# Patient Record
Sex: Male | Born: 1960
Health system: Midwestern US, Community
[De-identification: ages and names within clinical notes are randomized; demographics above are authoritative.]

---

## 2012-03-10 NOTE — ED Provider Notes (Signed)
I was personally available for consultation in the emergency department. I have reviewed the chart prior to the patient's discharge and agree with the documentation recorded by the MLP, including the assessment, treatment plan, and disposition.

## 2012-03-10 NOTE — ED Provider Notes (Signed)
HPI Comments: 4:09 PM   51 y.o. male presents to ED C/O left upper gum pain onset last week. The pt has dentures. The pain started last week, went away and returned 2 days ago. Pt felt like he had a fever last night but did not take his temperature. His temperature is 97.17F in the ED. The pt states he is able to swallow but doing so is painful. The pt does not have any of his original teeth, reports he lost his teeth in an accident. He confirms it has been a long time since he has been seen by a dentist. Pt states he works for a [employer], community and she will be back in the office either tomorrow or the next day. Pt denies noticing any pus in his mouth, allergies to any medications and any other sx's or complaints.    Patient is a 51 y.o. male presenting with dental problem. The history is provided by the patient. No language interpreter was used.   Dental Pain   This is a new problem. The current episode started 2 days ago. The problem occurs constantly. The problem has been gradually worsening. The pain is located in the left upper mouth.    Written by Broadus John, ED Scribe, as dictated by Zebedee Iba, PA-C.      History reviewed. No pertinent past medical history.     History reviewed. No pertinent past surgical history.      History reviewed. No pertinent family history.     History     Social History   ??? Marital Status: SINGLE     Spouse Name: N/A     Number of Children: N/A   ??? Years of Education: N/A     Occupational History   ??? Not on file.     Social History Main Topics   ??? Smoking status: Current Every Day Smoker   ??? Smokeless tobacco: Not on file   ??? Alcohol Use: No   ??? Drug Use: No   ??? Sexually Active: Not on file     Other Topics Concern   ??? Not on file     Social History Narrative   ??? No narrative on file            ALLERGIES: Review of patient's allergies indicates no known allergies.      Review of Systems   Constitutional: Negative for activity change, appetite change and unexpected weight change.    HENT: Positive for dental problem. Negative for congestion and sore throat.         (+) left upper gum pain   Respiratory: Negative for shortness of breath.    Cardiovascular: Negative for chest pain.   Gastrointestinal: Negative for nausea, vomiting, abdominal pain and diarrhea.   Genitourinary: Negative for difficulty urinating.   Musculoskeletal: Negative for back pain.   Skin: Negative for rash.   Neurological: Negative for headaches.   All other systems reviewed and are negative.        Filed Vitals:    03/10/12 1552   BP: 129/79   Pulse: 74   Temp: 97.4 ??F (36.3 ??C)   Resp: 18   Height: 5\' 10"  (1.778 m)   Weight: 74.844 kg (165 lb)   SpO2: 98%            Physical Exam   Nursing note and vitals reviewed.  Constitutional: He appears well-developed and well-nourished. No distress.   HENT:   Head: Normocephalic and atraumatic. No trismus in  the jaw.   Mouth/Throat: He has dentures. No oral lesions. Abnormal dentition. No uvula swelling. No oropharyngeal exudate, posterior oropharyngeal edema, posterior oropharyngeal erythema or tonsillar abscesses.   All teeth have been pulled, dentures removed. Upper left superolateral gumline erythematous and slightly inflamed along line of contact with dentures. No obvious abscess or drainage, +TTP. Mild swelling of cheek overlying tender gums, no erythema, warmth or fluctuance.    Neck: Normal range of motion. Neck supple. No tracheal deviation present.   Pulmonary/Chest: No stridor.   Lymphadenopathy:     He has no cervical adenopathy.   Skin: He is not diaphoretic.        MDM     Amount and/or Complexity of Data Reviewed:    Decide to obtain previous medical records or to obtain history from someone other than the patient:  No   Obtain history from someone other than the patient:  No   Review and summarize past medical records:  No   Discuss the patient with another provider:  No   Independant visualization of image, tracing, or specimen:  No      Procedures    Medications  - No data to display     PROGRESS NOTE:   4:16 PM   Answered pt's questions regarding treatment.  Written by Broadus John, ED Scribe, as dictated by Zebedee Iba, PA-C.       RESULTS         Labs Reviewed - No data to display    No results found for this or any previous visit (from the past 12 hour(s)).      Greycen Fluhr's  results have been reviewed with him.  He has been counseled regarding his diagnosis, treatment, and plan.  He verbally conveys understanding and agreement of the signs, symptoms, diagnosis, treatment and prognosis and additionally agrees to follow up as discussed.  He also agrees with the care-plan and conveys that all of his questions have been answered.  I have also provided discharge instructions for him that include: educational information regarding their diagnosis and treatment, and list of reasons why they would want to return to the ED prior to their follow-up appointment, should his condition change.    CLINICAL IMPRESSION    1. Gum inflammation    2. Facial pain        Written by Broadus John, ED Scribe, as dictated by Zebedee Iba, PA-C.

## 2012-03-10 NOTE — ED Notes (Signed)
I have reviewed discharge instructions with the patient.  The patient verbalized understanding.

## 2012-03-10 NOTE — ED Notes (Signed)
Triage note: pt states he has pain to left upper jaw area associated with facial swelling.  Pt has dentures

## 2013-01-04 MED ADMIN — lidocaine (XYLOCAINE) 10 mg/mL (1 %) injection 2 mL: SUBCUTANEOUS | @ 07:00:00 | NDC 00409427601
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
## 2013-01-04 NOTE — ED Notes (Signed)
Patient (s)  given copy of dc instructions and 1 script(s).  Patient (s)  verbalized understanding of instructions and script (s).  Patient given a current medication reconciliation form and verbalized understanding of their medications.   Patient (s) verbalized understanding of the importance of discussing medications with  his or her physician or clinic they will be following up with.  Patient alert and oriented and in no acute distress.  Patient discharged home ambulatory with self.
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
## 2013-01-28 NOTE — Progress Notes (Signed)
Called [facility]enee to send patient for CT of Knee
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
## 2013-01-28 NOTE — ED Notes (Signed)
I have reviewed discharge instructions with the patient.  The patient verbalized understanding. Patient armband removed and shredded

## 2013-01-29 MED ADMIN — cephALEXin (KEFLEX) capsule 500 mg: ORAL | @ 03:00:00 | NDC 68180012101

## 2013-01-29 MED ADMIN — ibuprofen (MOTRIN) tablet 800 mg: ORAL | @ 03:00:00 | NDC 68084065811

## 2013-12-01 NOTE — ED Notes (Signed)
I have reviewed discharge instructions with the patient.  The patient verbalized understanding. Patient armband removed and shredded. Patient unhappy with tylenol/codeine prescription. Patient stated he has taken them before and they don't work and he was going to "buy something from someone else". Nurse noted smell of alcohol on patient's breath. Nurse asked if he would like to ask PA for another medication for pain. Patient declined.

## 2013-12-01 NOTE — ED Provider Notes (Signed)
HPI Comments: 9:16 PM   53 y.o. male presents to ED complaining of swollen, painful abscess to left buttock onset 4 days ago. Pt says his abscess felt like a tick and when he scratched it, "it blew up". Associated symptoms include itching. Pt says he stuck a nail in it and was able to get puss out. Pt works in [employer]. Patient denies hx of abscess, allergies to medications and any other symptoms or complaints.     Patient is a 53 y.o. male presenting with abscess. The history is provided by the patient. No language interpreter was used.   Abscess   This is a new problem. The current episode started more than 2 days ago (4 days). The problem has been gradually worsening. The rash is present on the left buttock. The pain is at a severity of 10/10. Associated symptoms include itching.      Written by Einar Grad, ED Scribe, as dictated by Zebedee Iba, PA-C      History reviewed. No pertinent past medical history.     Past Surgical History   Procedure Laterality Date   ??? Hx hernia repair           History reviewed. No pertinent family history.     History     Social History   ??? Marital Status: SINGLE     Spouse Name: N/A     Number of Children: N/A   ??? Years of Education: N/A     Occupational History   ??? Not on file.     Social History Main Topics   ??? Smoking status: Current Every Day Smoker -- 0.50 packs/day   ??? Smokeless tobacco: Not on file   ??? Alcohol Use: Yes      Comment: occasionally   ??? Drug Use: No   ??? Sexual Activity: Not on file     Other Topics Concern   ??? Not on file     Social History Narrative                  ALLERGIES: Review of patient's allergies indicates no known allergies.      Review of Systems   Constitutional: Negative for fever and fatigue.   HENT: Negative for rhinorrhea and sore throat.    Respiratory: Negative for cough and shortness of breath.    Cardiovascular: Negative for chest pain and palpitations.   Gastrointestinal: Negative for nausea, vomiting, abdominal pain and  diarrhea.   Genitourinary: Negative for dysuria and difficulty urinating.   Musculoskeletal: Negative for myalgias and arthralgias.   Skin: Positive for itching. Negative for color change and rash.        Abscess to left buttock   Neurological: Negative for light-headedness and headaches.   All other systems reviewed and are negative.      Filed Vitals:    12/01/13 2057   BP: 132/78   Pulse: 87   Temp: 97.7 ??F (36.5 ??C)   Resp: 16   Height: 5\' 10"  (1.778 m)   Weight: 77.111 kg (170 lb)   SpO2: 98%            Physical Exam   Nursing note and vitals reviewed.   Vital signs and nursing notes reviewed.    CONSTITUTIONAL: Alert. Well-appearing; well-nourished; in no apparent distress.  EXT: Normal ROM in all four extremities; non-tender to palpation.  SKIN: Normal for age and race; warm; dry; Left medial buttock: 2cm firm, nonfluctuant tender area of indurated skin, +1.5cm  of surrounding nontender erythema, no drainage, vesicles or other rash.   NEURO: A & O x3.   PSYCH:  Mood and affect appropriate.       RESULTS:    No orders to display        Labs Reviewed - No data to display    No results found for this or any previous visit (from the past 12 hour(s)).     MDM     MEDICATIONS GIVEN:  Medications - No data to display     Procedures    PROGRESS NOTE:  9:16 PM  Initial assessment performed. Left buttock abscess, without fluctuance, not ready for I&D, will try warm compresses and abx; if worsens, pt advised to return and I&D may need to be performed in the future.   Written by Einar Grad, ED Scribe, as dictated by Zebedee Iba, PA-C     DISCHARGE NOTE:  9:30 PM     Artist Pais Looper's  results have been reviewed with him.  He has been counseled regarding his diagnosis, treatment, and plan.  He verbally conveys understanding and agreement of the signs, symptoms, diagnosis, treatment and prognosis and additionally agrees to follow up as discussed.   He also agrees with the care-plan and conveys that all of his questions have been answered.  I have also provided discharge instructions for him that include: educational information regarding their diagnosis and treatment, and list of reasons why they would want to return to the ED prior to their follow-up appointment, should his condition change.    CLINICAL IMPRESSION:    1. Abscess of buttock, left        PLAN: DISCHARGE HOME    Follow-up Information    Follow up With Details Comments Contact Info    [facility]   [address] [place] [address]  [place] [address]  [phone]    [facility] EMERGENCY DEPT  If it gets bigger, come back to this facility. [address]  [place] [address]  [phone]          Current Discharge Medication List      START taking these medications    Details   trimethoprim-sulfamethoxazole (BACTRIM DS) 160-800 mg per tablet Take 1 Tab by mouth two (2) times a day for 7 days.  Qty: 14 Tab, Refills: 0      acetaminophen-codeine (TYLENOL-CODEINE #3) 300-30 mg per tablet Take 1 Tab by mouth every six (6) hours as needed for Pain. Max Daily Amount: 4 Tabs.  Qty: 12 Tab, Refills: 0             Written by Einar Grad, ED Scribe, as dictated by Zebedee Iba, PA-C .       I agree with the above documentation as written by the scribe.  Zebedee Iba, PA-C

## 2013-12-01 NOTE — ED Notes (Signed)
Patient stated he has had tetanus shot in the past 10 years and "doesn't need another".

## 2013-12-01 NOTE — ED Notes (Signed)
Abscess to left buttock.

## 2013-12-02 MED ORDER — TRIMETHOPRIM-SULFAMETHOXAZOLE 160 MG-800 MG TAB
160-800 mg | ORAL_TABLET | Freq: Two times a day (BID) | ORAL | Status: AC
Start: 2013-12-02 — End: 2013-12-08

## 2013-12-02 MED ORDER — ACETAMINOPHEN-CODEINE 300 MG-30 MG TAB
300-30 mg | ORAL_TABLET | Freq: Four times a day (QID) | ORAL | Status: DC | PRN
Start: 2013-12-02 — End: 2014-11-17

## 2014-09-18 ENCOUNTER — Emergency Department (HOSPITAL_COMMUNITY): Payer: Self-pay

## 2014-09-18 ENCOUNTER — Encounter (HOSPITAL_COMMUNITY): Payer: Self-pay | Admitting: Emergency Medicine

## 2014-09-18 ENCOUNTER — Emergency Department (HOSPITAL_COMMUNITY)
Admission: EM | Admit: 2014-09-18 | Discharge: 2014-09-18 | Disposition: A | Discharge status: Home / Self Care | Payer: Self-pay | Attending: Emergency Medicine | Admitting: Emergency Medicine

## 2014-09-18 DIAGNOSIS — S93401A Sprain of unspecified ligament of right ankle, initial encounter: Secondary | ICD-10-CM | POA: Insufficient documentation

## 2014-09-18 DIAGNOSIS — Y9389 Activity, other specified: Secondary | ICD-10-CM | POA: Insufficient documentation

## 2014-09-18 DIAGNOSIS — W11XXXA Fall on and from ladder, initial encounter: Secondary | ICD-10-CM | POA: Insufficient documentation

## 2014-09-18 DIAGNOSIS — Z720 Tobacco use: Secondary | ICD-10-CM | POA: Insufficient documentation

## 2014-09-18 DIAGNOSIS — W19XXXA Unspecified fall, initial encounter: Secondary | ICD-10-CM

## 2014-09-18 DIAGNOSIS — Y92008 Other place in unspecified non-institutional (private) residence as the place of occurrence of the external cause: Secondary | ICD-10-CM | POA: Insufficient documentation

## 2014-09-18 DIAGNOSIS — Y998 Other external cause status: Secondary | ICD-10-CM | POA: Insufficient documentation

## 2014-09-18 DIAGNOSIS — T7501XA Shock due to being struck by lightning, initial encounter: Secondary | ICD-10-CM | POA: Insufficient documentation

## 2014-09-18 MED ORDER — IBUPROFEN 800 MG PO TABS: 800.0000 mg | ORAL_TABLET | Freq: Three times a day (TID) | ORAL | Status: AC

## 2014-09-18 MED ORDER — OXYCODONE-ACETAMINOPHEN 5-325 MG PO TABS
2.0000 | ORAL_TABLET | Freq: Once | ORAL | Status: AC
  Administered 2014-09-18: 2 via ORAL
  Filled 2014-09-18: qty 2

## 2014-09-18 NOTE — Discharge Instructions (Signed)
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
## 2014-11-17 ENCOUNTER — Inpatient Hospital Stay
Admit: 2014-11-17 | Discharge: 2014-11-17 | Disposition: A | Discharge status: Home / Self Care | Payer: Self-pay | Attending: Internal Medicine

## 2014-11-17 DIAGNOSIS — L309 Dermatitis, unspecified: Secondary | ICD-10-CM

## 2014-11-17 MED ORDER — HYDROXYZINE 50 MG TAB
50 mg | ORAL_TABLET | Freq: Four times a day (QID) | ORAL | Status: DC | PRN
Start: 2014-11-17 — End: 2014-11-18

## 2014-11-17 MED ORDER — PERMETHRIN 5 % TOPICAL CREAM
5 % | CUTANEOUS | Status: DC
Start: 2014-11-17 — End: 2014-11-18

## 2014-11-17 NOTE — ED Provider Notes (Signed)
HPI Comments: 7:01 PM  Colbert A Trew is a 54 y.o. maSebastian Achele presenting to the ED c/o feeling like there are bugs crawling all over him x a few days. Reports he thinks he has Scabies. States he felt like there was a lesion on his right ear and he picked at it and when it "blew up" he noticed "they were spreading all over my body. Once you piss them off they really get everywhere." Associated sxs include itching. Reports he has not taken anything for his sxs. Admits to picking at his skin resulting in various scabs dispersed across his body and face. Denies any other sxs or complaints.     Patient is a 54 y.o. male presenting with rash. The history is provided by the patient.   Rash   This is a new problem. There has been no fever. The rash is present on the left lower leg, right lower leg, face, head, left arm and right arm. The patient is experiencing no pain. Associated symptoms include blisters, itching and pain.      Written by Georga HackingSapphira Brown, ED Scribe, as dictated by Synthia InnocentBrannon Tavarion Babington, PA-C      Past Medical History:   Diagnosis Date   ??? Scabies        Past Surgical History:   Procedure Laterality Date   ??? Hx hernia repair           History reviewed. No pertinent family history.    History     Social History   ??? Marital Status: SINGLE     Spouse Name: N/A   ??? Number of Children: N/A   ??? Years of Education: N/A     Occupational History   ??? Not on file.     Social History Main Topics   ??? Smoking status: Current Every Day Smoker -- 0.50 packs/day   ??? Smokeless tobacco: Not on file   ??? Alcohol Use: Yes      Comment: occasionally   ??? Drug Use: No   ??? Sexual Activity: Not on file     Other Topics Concern   ??? Not on file     Social History Narrative         ALLERGIES: Review of patient's allergies indicates no known allergies.    Review of Systems   Constitutional: Negative for fever and fatigue.   HENT: Negative for rhinorrhea and sore throat.    Respiratory: Negative for cough and shortness of breath.     Cardiovascular: Negative for chest pain and palpitations.   Gastrointestinal: Negative for nausea, vomiting, abdominal pain and diarrhea.   Genitourinary: Negative for dysuria and difficulty urinating.   Musculoskeletal: Negative for myalgias and arthralgias.   Skin: Positive for itching, rash and wound (lesions all over body from picked scabs). Negative for color change.   Neurological: Negative for light-headedness and headaches.   All other systems reviewed and are negative.      Filed Vitals:    11/17/14 1856   BP: 142/74   Pulse: 105   Temp: 98.2 ??F (36.8 ??C)   Resp: 18   Height: 5\' 10"  (1.778 m)   Weight: 83.915 kg (185 lb)   SpO2: 93%            Physical Exam   Constitutional: He is oriented to person, place, and time. He appears well-developed and well-nourished.   Slightly anxious appearing   HENT:   Head: Normocephalic and atraumatic.   Eyes: Conjunctivae and EOM are normal.  Pupils are equal, round, and reactive to light.   Neck: Normal range of motion. Neck supple.   Musculoskeletal: Normal range of motion.   Neurological: He is alert and oriented to person, place, and time.   Skin: Skin is warm and dry.        +numerous small scabbed lesions noted to BLE's BUE's face & right ear; no web space lesions, no linear distributions of wounds, no exudate ir suprainfection; lesions appear more like picked at insect bites   Psychiatric: He has a normal mood and affect. His behavior is normal.   Nursing note and vitals reviewed.       RESULTS:    No orders to display        Labs Reviewed - No data to display    No results found for this or any previous visit (from the past 12 hour(s)).    MDM    MEDICATIONS GIVEN:  Medications - No data to display    Procedures    PROGRESS NOTE:  7:01 PM  Initial assessment performed.  Written by Georga Hacking, ED Scribe, as dictated by Synthia Innocent, PA-C    DISCHARGE NOTE:  7:09 PM   Artist Pais Golda's  results have been reviewed with him.  He has been  counseled regarding his diagnosis, treatment, and plan.  He verbally conveys understanding and agreement of the signs, symptoms, diagnosis, treatment and prognosis and additionally agrees to follow up as discussed.  He also agrees with the care-plan and conveys that all of his questions have been answered.  I have also provided discharge instructions for him that include: educational information regarding their diagnosis and treatment, and list of reasons why they would want to return to the ED prior to their follow-up appointment, should his condition change. The patient has been provided with education for proper Emergency Department utilization.    CLINICAL IMPRESSION:    1. Dermatitis        PLAN: DISCHARGE HOME    Follow-up Information     Follow up With Details Comments Contact Info    [facility] Schedule an appointment as soon as possible for a visit in 2 days for PCP follow up [address] [place] [address]  [place] [address]  [phone]    [facility] EMERGENCY DEPT Go to As needed, If symptoms worsen [address]  [place] [address]  [phone]          Current Discharge Medication List      START taking these medications    Details   hydrOXYzine (ATARAX) 50 mg tablet Take 1 Tab by mouth every six (6) hours as needed for Itching for up to 10 days.  Qty: 20 Tab, Refills: 0      permethrin (ACTICIN) 5 % topical cream apply sparingly as directed  Qty: 60 g, Refills: 0                 SCRIBE ATTESTATION STATEMENT  Documented [id number] Manson Passey, scribing for and in the presence of Jerriann Schrom, PA-C.     PROVIDER ATTESTATION STATEMENT  I personally performed the services described in the documentation, reviewed the documentation, as recorded by the scribe in my presence, and it accurately and completely records my words and actions.  [employer], PA-C.

## 2014-11-17 NOTE — ED Notes (Signed)
Patient reports scabs and "something alive in my skin. I think I have scabies."

## 2014-11-17 NOTE — ED Notes (Signed)
I have reviewed discharge instructions with the patient.  The patient verbalized understanding.  Patient armband removed and given to patient to take home.  Patient was informed of the privacy risks if armband lost or stolen

## 2014-11-18 ENCOUNTER — Inpatient Hospital Stay
Admit: 2014-11-18 | Discharge: 2014-11-18 | Disposition: A | Discharge status: Home / Self Care | Payer: Self-pay | Attending: Emergency Medicine

## 2014-11-18 DIAGNOSIS — B86 Scabies: Secondary | ICD-10-CM

## 2014-11-18 MED ORDER — PERMETHRIN 5 % TOPICAL CREAM
5 % | CUTANEOUS | Status: AC
Start: 2014-11-18 — End: 2014-11-18
  Administered 2014-11-18: 13:00:00 via TOPICAL

## 2014-11-18 MED ORDER — HYDROXYZINE 50 MG TAB
50 mg | ORAL_TABLET | Freq: Four times a day (QID) | ORAL | Status: AC | PRN
Start: 2014-11-18 — End: 2014-11-28

## 2014-11-18 MED ORDER — PERMETHRIN 5 % TOPICAL CREAM
5 % | CUTANEOUS | Status: DC
Start: 2014-11-18 — End: 2016-03-14

## 2014-11-18 MED FILL — PERMETHRIN 5 % TOPICAL CREAM: 5 % | CUTANEOUS | Qty: 60

## 2014-11-18 NOTE — ED Notes (Signed)
Discharge instructions given to pt.  All questions answered and pt verbalized understanding.   V/S stable @ time of discharge.  Pt ambulatory out of unit.

## 2014-11-18 NOTE — ED Provider Notes (Signed)
HPI Comments: 7:47 AM   Jacob Turner is a 54 y.o. male with hx of Scabies presenting to the ED C/O "parasites" over his body. The pt was seen here in the ED yesterday for similar sx's. He reports there is something moving in his scab to the tip of his LT index finger. Pt states he took 5 "hot baths" yesterday and notes "everything is popping out." He denies using the cream he was prescribed due to him not being able to get it. Pt denies any other Sx or complaints.     Written by Doran Clay, ED Scribe, as dictated by Romero Liner, PA-C    Patient is a 54 y.o. male presenting with rash. The history is provided by the patient. No language interpreter was used.   Rash   This is a new problem. The problem has not changed since onset.Affected Location: Generalized body. Treatments tried: "5 hot baths" The treatment provided no relief.        Past Medical History:   Diagnosis Date   ??? Scabies        Past Surgical History:   Procedure Laterality Date   ??? Hx hernia repair           History reviewed. No pertinent family history.    History     Social History   ??? Marital Status: SINGLE     Spouse Name: N/A   ??? Number of Children: N/A   ??? Years of Education: N/A     Occupational History   ??? Not on file.     Social History Main Topics   ??? Smoking status: Current Every Day Smoker -- 0.50 packs/day   ??? Smokeless tobacco: Not on file   ??? Alcohol Use: Yes      Comment: occasionally   ??? Drug Use: No   ??? Sexual Activity: Not on file     Other Topics Concern   ??? Not on file     Social History Narrative         ALLERGIES: Review of patient's allergies indicates no known allergies.    Review of Systems   Skin: Positive for rash (Scabs over generalized body).   All other systems reviewed and are negative.      Filed Vitals:    11/18/14 0740   BP: 166/88   Pulse: 86   Temp: 97.9 ??F (36.6 ??C)   Resp: 20   Height:  (1.778 m)   Weight: 83.915 kg (185 lb)   SpO2: 100%            Physical Exam    Constitutional: He appears well-developed and well-nourished. No distress.   HENT:   Head: Normocephalic and atraumatic.   Right Ear: Tympanic membrane and ear canal normal.   Left Ear: Tympanic membrane and ear canal normal.   Mouth/Throat: Uvula is midline and mucous membranes are normal.   Neck: Trachea normal, normal range of motion and full passive range of motion without pain. Neck supple.   Cardiovascular: Normal rate, regular rhythm, normal heart sounds and normal pulses.    Pulmonary/Chest: Effort normal and breath sounds normal.   Abdominal: Normal appearance.   Musculoskeletal: Normal range of motion.   Neurological: He is alert. He has normal strength and normal reflexes.   Skin: Skin is warm and dry. Lesion noted. He is not diaphoretic.   Multiple scattered lesions with excoriations where patient has scabs and has picked at skin.  No secondary infection noted.  No web space involvement and no linear presentation or evidence of tunneling.   Psychiatric: His speech is normal and behavior is normal. Judgment normal. His mood appears anxious.   Nursing note and vitals reviewed.       RESULTS:    No orders to display        Labs Reviewed - No data to display    No results found for this or any previous visit (from the past 12 hour(s)).     MDM  Number of Diagnoses or Management Options  Scabies:   Diagnosis management comments: Could be scabies based on Hx.  Patient was Rx cream for scabies yesterday but did not fell it due to cost.  Will get life coach involved to help with Rx.       Amount and/or Complexity of Data Reviewed  Review and summarize past medical records: yes         MEDICATIONS GIVEN:  Medications - No data to display     Procedures     PROGRESS NOTE:  7:47 PM  Initial assessment performed.  Written by Doran ClayLasha' Tipton, ED Scribe, as dictated by Romero Linerobert Ladaisha Portillo, PA-C    DISCHARGE NOTE:  8:03 AM    Jacob Turner's  results have been reviewed with him.  He has been  counseled regarding his diagnosis, treatment, and plan.  He verbally conveys understanding and agreement of the signs, symptoms, diagnosis, treatment and prognosis and additionally agrees to follow up as discussed.  He also agrees with the care-plan and conveys that all of his questions have been answered.  I have also provided discharge instructions for him that include: educational information regarding their diagnosis and treatment, and list of reasons why they would want to return to the ED prior to their follow-up appointment, should his condition change. The patient and/or family has been provided with education for proper Emergency Department utilization.    CLINICAL IMPRESSION:    1. Scabies        PLAN: DISCHARGE HOME    Follow-up Information     Follow up With Details Comments Contact Info    [facility] Schedule an appointment as soon as possible for a visit  [address]  [place] [id number]  [place] [id number]  [phone]    [facility]  As needed, If symptoms worsen [address]  [place] [id number]  [phone]          Current Discharge Medication List      CONTINUE these medications which have CHANGED    Details   permethrin (ACTICIN) 5 % topical cream apply sparingly as directed  Qty: 60 g, Refills: 0      hydrOXYzine (ATARAX) 50 mg tablet Take 1 Tab by mouth every six (6) hours as needed for Itching for up to 10 days.  Qty: 20 Tab, Refills: 0             ATTESTATIONS:  This note is prepared by Doran ClayLasha' Tipton, acting as Scribe for [employer] Sidni Fusco, PA-C.    Romero Linerobert Jameek Bruntz, PA-C: The scribe's documentation has been prepared under my direction and personally reviewed by me in its entirety. I confirm that the note above accurately reflects all work, treatment, procedures, and medical decision making performed by me.

## 2014-11-18 NOTE — ED Notes (Signed)
States he was not signing any papers. Patient very impulsive.

## 2014-11-18 NOTE — ED Notes (Signed)
Patient was seen here last night and states that he has parasites crawling inside of him. Patient extremely agitated in triage.  Patient insistent that he was oozing white stuff.  Patient with flight of ideas in triage. Sepsis Screening completed    (  )Patient meets SIRS criteria.  ( x)Patient does not meet SIRS criteria.      SIRS Criteria is achieved when two or more of the following are present  ? Temperature < 96.8??F (36??C) or > 100.9??F (38.3??C)  ? Heart Rate > 90 beats per minute  ? Respiratory Rate > 20 beats per minute  ? WBC count > 12,000 or <4,000 or > 10% bands

## 2014-11-21 MED ORDER — PERMETHRIN 5 % TOPICAL CREAM
5 % | CUTANEOUS | Status: AC
Start: 2014-11-21 — End: 2014-11-21

## 2014-11-21 NOTE — Other (Signed)
Pt called ED stating he didn't have enough cream, B Cater PA made aware and will write another script for permethrin, pt informed to pick up script at ED registration. Per B. Cater PA noted on pt's chart that he had rec'd a script for permethrin on 7/12 and 7/13.

## 2015-12-31 IMAGING — CR DG ANKLE COMPLETE 3+V*R*
3 series · 3 of 3 positions shown · non-contrast
Comparison: None.

CLINICAL DATA: Medial ankle pain.  Fall from ladder.

EXAM:
RIGHT ANKLE - COMPLETE 3+ VIEW

[x ankle ap right]
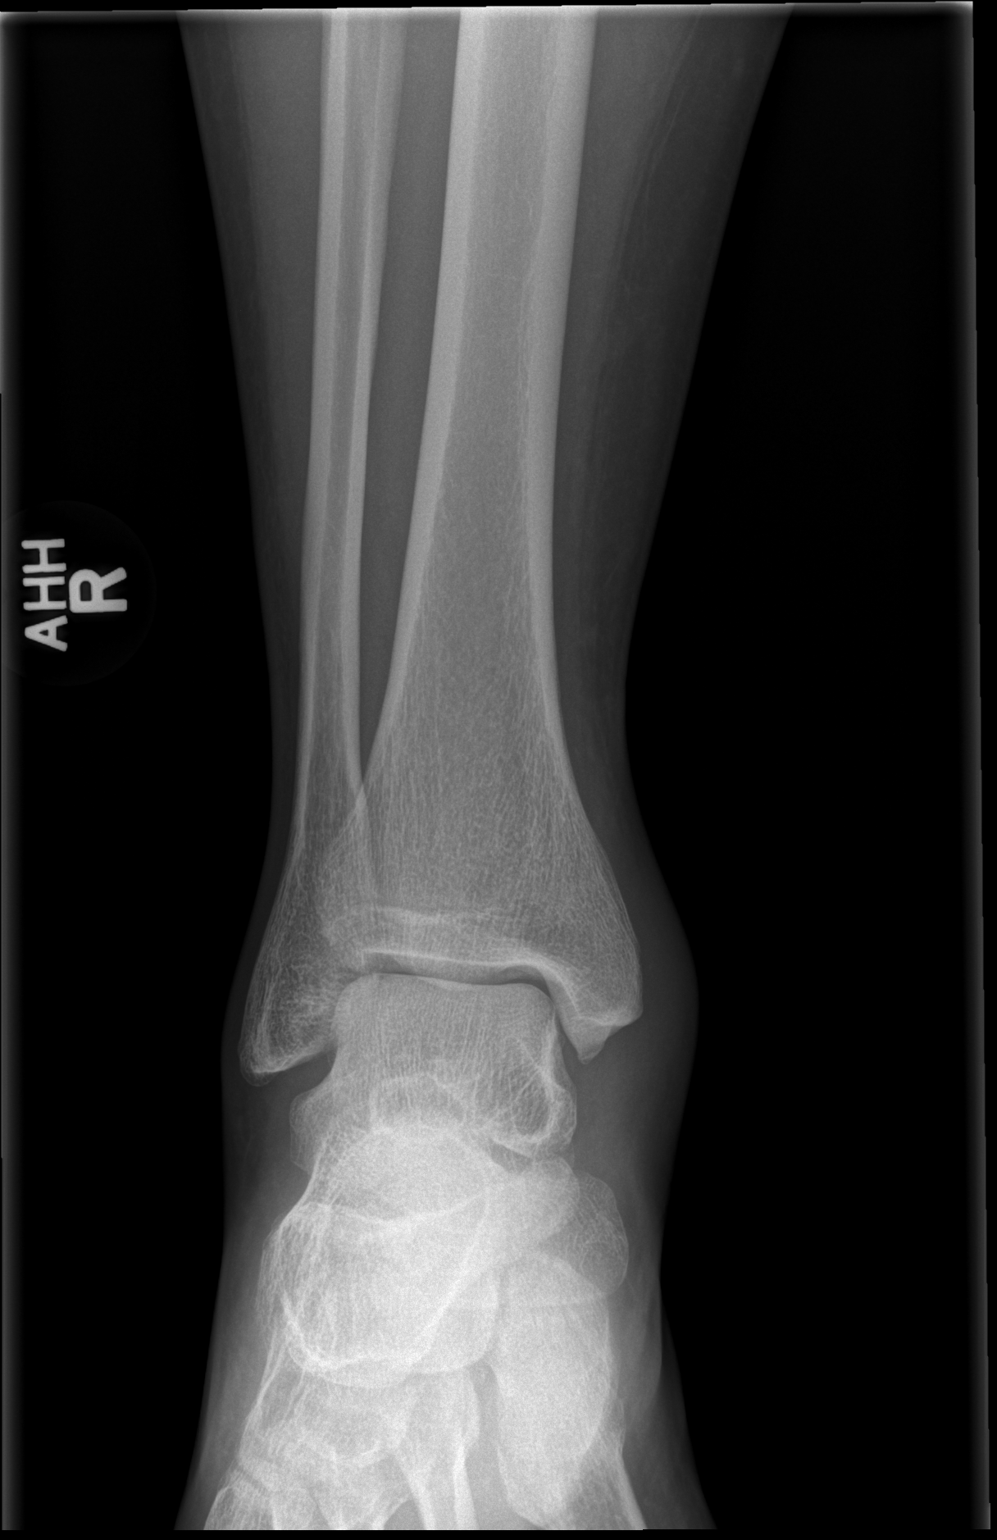

[x ankle obl right]
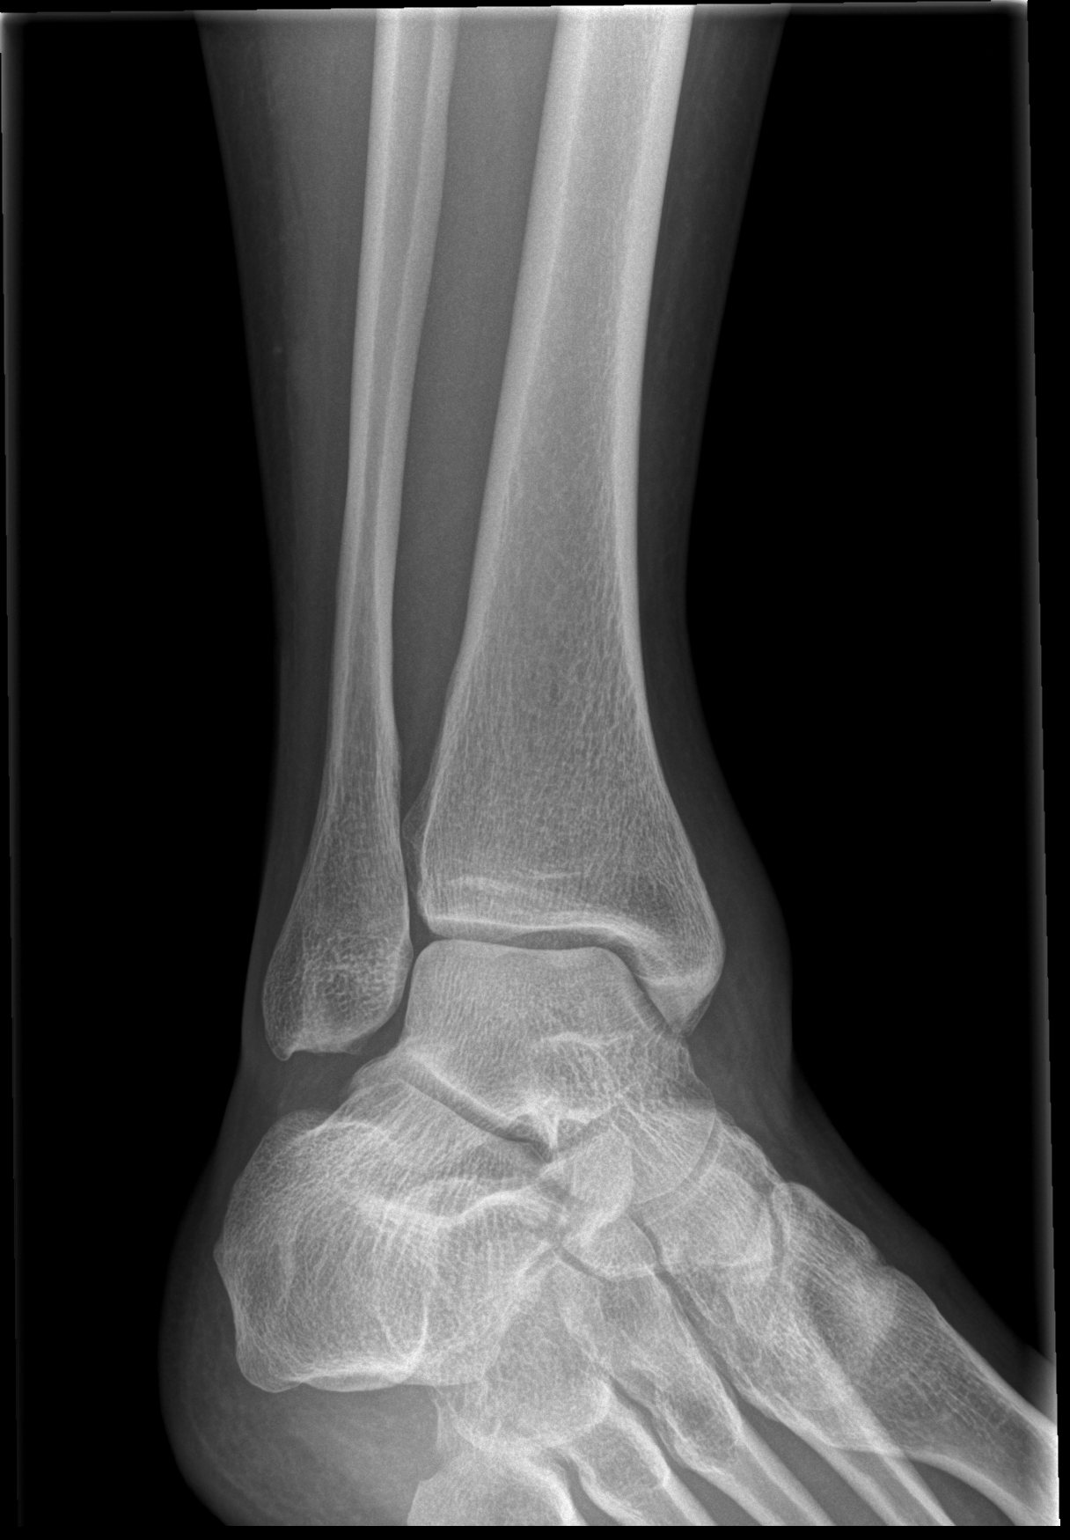

[x ankle lat right]
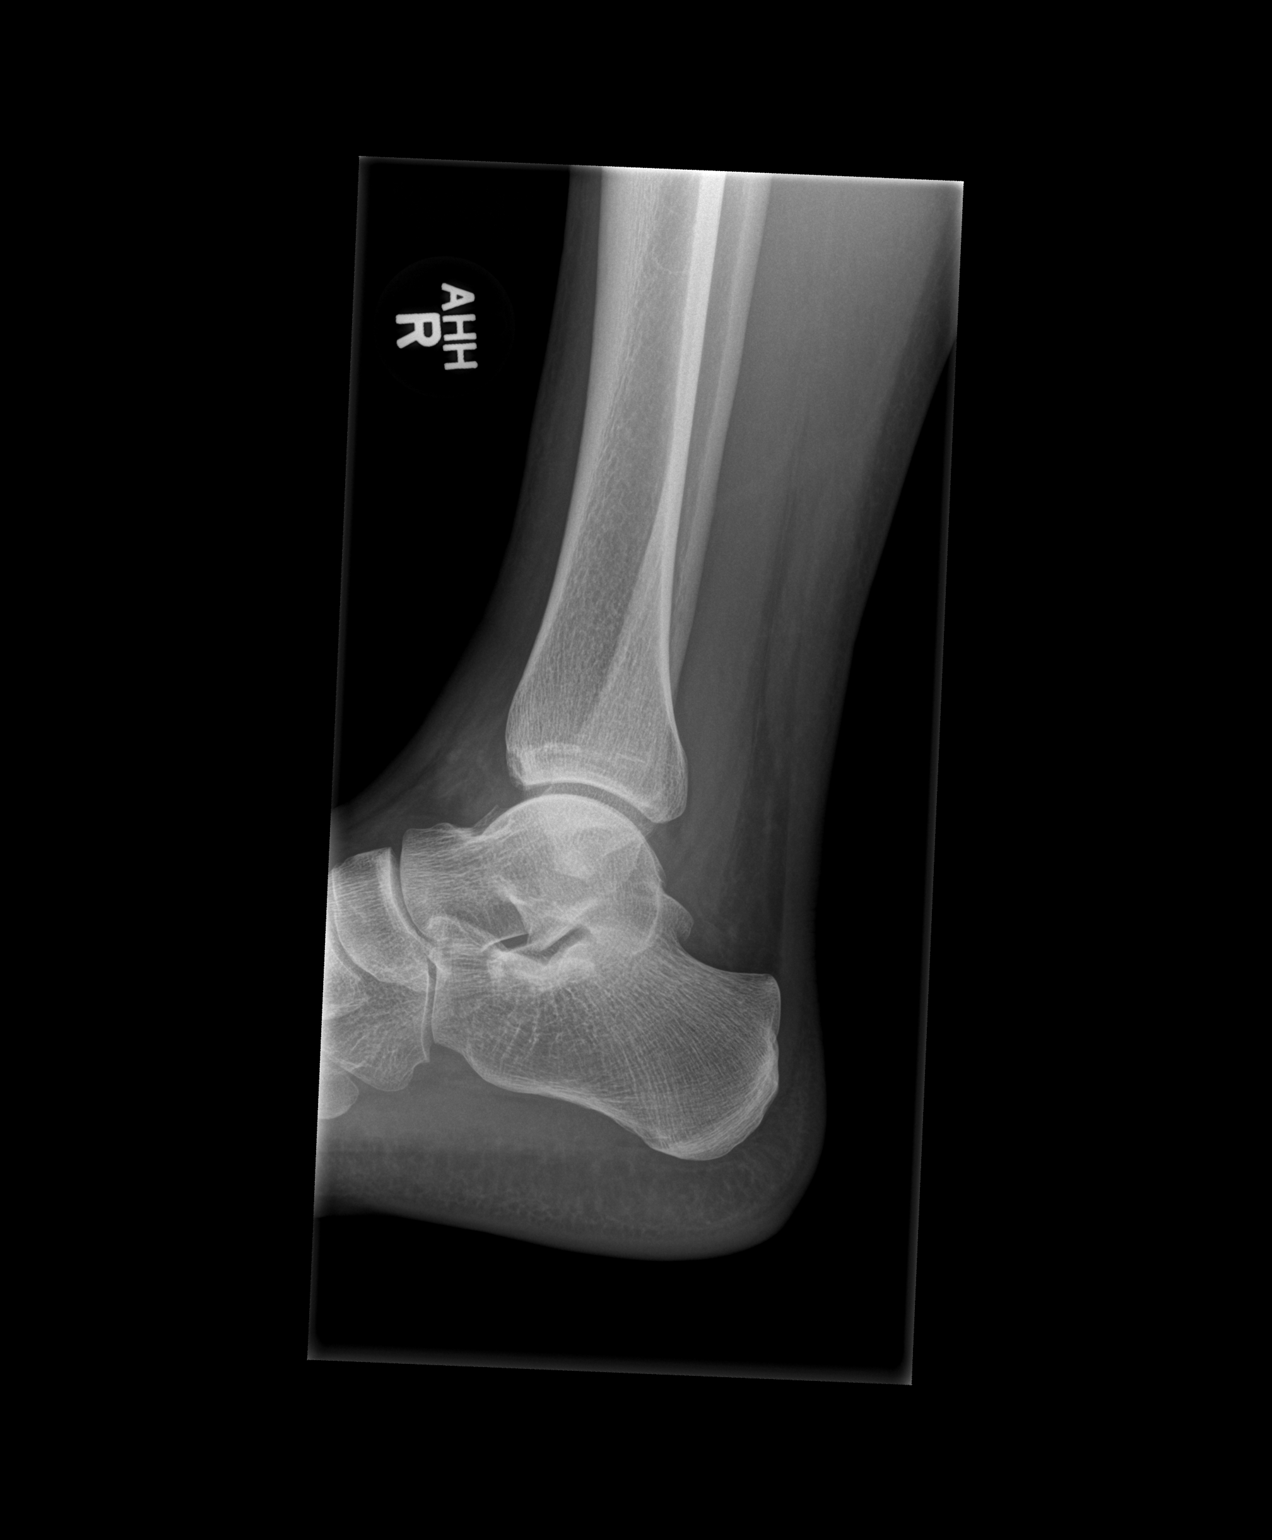

[3 of 3 positions shown; findings below may reference images not displayed]

FINDINGS: Soft tissue swelling is present over the media malleable is. There
is no underlying fracture. The ankle joint is located. A joint
effusion is noted.
IMPRESSION: 1. Soft tissue swelling along the medial malleolus and associated
joint effusion.
2. No acute fracture or dislocation.

## 2016-03-14 ENCOUNTER — Emergency Department: Admit: 2016-03-14

## 2016-03-14 ENCOUNTER — Inpatient Hospital Stay
Admit: 2016-03-14 | Discharge: 2016-03-14 | Disposition: A | Discharge status: Home / Self Care | Attending: Emergency Medicine

## 2016-03-14 DIAGNOSIS — R112 Nausea with vomiting, unspecified: Secondary | ICD-10-CM

## 2016-03-14 LAB — URINALYSIS W/ RFLX MICROSCOPIC
Bilirubin: NEGATIVE
Blood: NEGATIVE
Glucose: NEGATIVE mg/dL
Ketone: NEGATIVE mg/dL
Nitrites: NEGATIVE
Protein: NEGATIVE mg/dL
Specific gravity: 1.026 (ref 1.005–1.030)
Urobilinogen: 1 EU/dL (ref 0.2–1.0)
pH (UA): 5 (ref 5.0–8.0)

## 2016-03-14 LAB — METABOLIC PANEL, COMPREHENSIVE
A-G Ratio: 0.8 (ref 0.8–1.7)
ALT (SGPT): 47 U/L (ref 16–61)
AST (SGOT): 29 U/L (ref 15–37)
Albumin: 3.6 g/dL (ref 3.4–5.0)
Alk. phosphatase: 120 U/L — ABNORMAL HIGH (ref 45–117)
Anion gap: 13 mmol/L (ref 3.0–18)
BUN/Creatinine ratio: 19 (ref 12–20)
BUN: 18 MG/DL (ref 7.0–18)
Bilirubin, total: 0.9 MG/DL (ref 0.2–1.0)
CO2: 24 mmol/L (ref 21–32)
Calcium: 9.2 MG/DL (ref 8.5–10.1)
Chloride: 101 mmol/L (ref 100–108)
Creatinine: 0.95 MG/DL (ref 0.6–1.3)
GFR est AA: >60 mL/min/{1.73_m2} (ref >60)
GFR est non-AA: >60 mL/min/{1.73_m2} (ref >60)
Globulin: 4.4 g/dL — ABNORMAL HIGH (ref 2.0–4.0)
Glucose: 109 mg/dL — ABNORMAL HIGH (ref 74–99)
Potassium: 4 mmol/L (ref 3.5–5.5)
Protein, total: 8 g/dL (ref 6.4–8.2)
Sodium: 138 mmol/L (ref 136–145)

## 2016-03-14 LAB — CARDIAC PANEL,(CK, CKMB & TROPONIN)
CK - MB: <1 ng/ml (ref <3.6)
CK: 160 U/L (ref 39–308)
Troponin-I, QT: <0.02 NG/ML (ref 0.00–0.06)

## 2016-03-14 LAB — CBC WITH AUTOMATED DIFF
ABS. BASOPHILS: 0 10*3/uL (ref 0.0–0.06)
ABS. EOSINOPHILS: 0.3 10*3/uL (ref 0.0–0.4)
ABS. LYMPHOCYTES: 3.1 10*3/uL (ref 0.9–3.6)
ABS. MONOCYTES: 1 10*3/uL (ref 0.05–1.2)
ABS. NEUTROPHILS: 5.6 10*3/uL (ref 1.8–8.0)
BASOPHILS: 0 % (ref 0–2)
EOSINOPHILS: 3 % (ref 0–5)
HCT: 44.2 % (ref 36.0–48.0)
HGB: 15.6 g/dL (ref 13.0–16.0)
LYMPHOCYTES: 31 % (ref 21–52)
MCH: 30.9 PG (ref 24.0–34.0)
MCHC: 35.3 g/dL (ref 31.0–37.0)
MCV: 87.5 FL (ref 74.0–97.0)
MONOCYTES: 10 % (ref 3–10)
MPV: 10 FL (ref 9.2–11.8)
NEUTROPHILS: 56 % (ref 40–73)
PLATELET: 259 10*3/uL (ref 135–420)
RBC: 5.05 M/uL (ref 4.70–5.50)
RDW: 12.6 % (ref 11.6–14.5)
WBC: 10 10*3/uL (ref 4.6–13.2)

## 2016-03-14 LAB — URINE MICROSCOPIC ONLY
RBC: NEGATIVE /hpf (ref 0–5)
WBC: 4 /hpf (ref 0–5)

## 2016-03-14 LAB — LIPASE: Lipase: 111 U/L (ref 73–393)

## 2016-03-14 MED ORDER — ONDANSETRON (PF) 4 MG/2 ML INJECTION
4 mg/2 mL | INTRAMUSCULAR | Status: AC
Start: 2016-03-14 — End: 2016-03-14
  Administered 2016-03-14: 12:00:00 via INTRAVENOUS

## 2016-03-14 MED ORDER — IOPAMIDOL 61 % IV SOLN
300 mg iodine /mL (61 %) | Freq: Once | INTRAVENOUS | Status: AC
Start: 2016-03-14 — End: 2016-03-14
  Administered 2016-03-14: 12:00:00 via INTRAVENOUS

## 2016-03-14 MED ORDER — ONDANSETRON (PF) 4 MG/2 ML INJECTION
4 mg/2 mL | INTRAMUSCULAR | Status: AC
Start: 2016-03-14 — End: 2016-03-14

## 2016-03-14 MED ORDER — GI COCKTAIL (MIH CMPD)
Freq: Four times a day (QID) | ORAL | Status: DC
Start: 2016-03-14 — End: 2016-03-14
  Administered 2016-03-14: 12:00:00 via ORAL

## 2016-03-14 MED ORDER — PANTOPRAZOLE 40 MG TAB, DELAYED RELEASE
40 mg | ORAL_TABLET | Freq: Every day | ORAL | 0 refills | Status: AC
Start: 2016-03-14 — End: 2016-04-03

## 2016-03-14 MED ORDER — FAMOTIDINE (PF) 20 MG/2 ML IV
20 mg/2 mL | INTRAVENOUS | Status: DC
Start: 2016-03-14 — End: 2016-03-14

## 2016-03-14 MED ORDER — FAMOTIDINE (PF) 20 MG/2 ML IV
20 mg/2 mL | INTRAVENOUS | Status: AC
Start: 2016-03-14 — End: 2016-03-14
  Administered 2016-03-14: 12:00:00 via INTRAVENOUS

## 2016-03-14 MED ORDER — SODIUM CHLORIDE 0.9% BOLUS IV
0.9 % | Freq: Once | INTRAVENOUS | Status: AC
Start: 2016-03-14 — End: 2016-03-14
  Administered 2016-03-14: 12:00:00 via INTRAVENOUS

## 2016-03-14 MED FILL — ISOVUE-300  61 % INTRAVENOUS SOLUTION: 300 mg iodine /mL (61 %) | INTRAVENOUS | Qty: 100

## 2016-03-14 MED FILL — SODIUM CHLORIDE 0.9 % IV: INTRAVENOUS | Qty: 1000

## 2016-03-14 MED FILL — FAMOTIDINE (PF) 20 MG/2 ML IV: 20 mg/2 mL | INTRAVENOUS | Qty: 2

## 2016-03-14 MED FILL — GI COCKTAIL (MIH CMPD): ORAL | Qty: 30

## 2016-03-14 MED FILL — ONDANSETRON (PF) 4 MG/2 ML INJECTION: 4 mg/2 mL | INTRAMUSCULAR | Qty: 2

## 2016-03-14 NOTE — ED Notes (Signed)
Report received - assumed care of pt at this time - Pt in CT, will reassess upon return to room.

## 2016-03-14 NOTE — ED Triage Notes (Signed)
Episode of vomiting X 3 hours on Saturday night, states he has been having pain in the epigastric area so bad that he could not come to ED until today

## 2016-03-14 NOTE — ED Provider Notes (Signed)
[facility]  EMERGENCY DEPARTMENT HISTORY AND PHYSICAL EXAM       Date: 03/14/2016   Patient Name: Jacob Turner   Date of Birth: 02-22-1961  Medical Record Number: [id number]    History of Presenting Illness     Chief Complaint   Patient presents with   ??? Abdominal Pain        History Provided By:  patient    Additional History: 6:06 AM   NAWAF STRANGE is a 55 y.o. male who presents to the emergency department C/O 5/10 abd pain onset the past few days. Associated sxs include black/red/brown/greasy vomiting onset a few days ago and intermittent rectal bleeding onset a few months ago. Pt has taken Tums with no relief. Pt is unsure if he has a hemorrhoid but has no hx. Last BM was 24 hours ago. NKDA. PSHx of hernia repair. SHx includes tobacco use (.5 ppd) and occasional EtOH use. Pt denies fever, PMHx of CVA, MI, PSHx of endoscopy and any other symptoms or complaints.    Written by Phillis Knack, ED Scribe, as dictated by Smitty Knudsen, MD     Primary Care Provider: None   Specialist:    Past History     Past Medical History:   Past Medical History:   Diagnosis Date   ??? Scabies         Past Surgical History:   Past Surgical History:   Procedure Laterality Date   ??? HX HERNIA REPAIR          Family History:   History reviewed. No pertinent family history.     Social History:   Social History   Substance Use Topics   ??? Smoking status: Current Every Day Smoker     Packs/day: 0.50     Years: 35.00   ??? Smokeless tobacco: Never Used   ??? Alcohol use Yes      Comment: occasionally        Allergies:   No Known Allergies     Review of Systems   Review of Systems   Constitutional: Negative for fever.   Gastrointestinal: Positive for abdominal pain (5/10), rectal pain and vomiting.   All other systems reviewed and are negative.      Physical Exam  Vitals:    03/14/16 0555 03/14/16 0624 03/14/16 0725 03/14/16 0728   BP: 146/84 140/75 133/78    Pulse: 81  60 60   Resp: 18   18    Temp: 98.4 ??F (36.9 ??C)      SpO2: 99%  100%    Weight: 90.7 kg (200 lb)      Height: 5' 10"  (1.778 m)          Physical Exam   Constitutional: He is oriented to person, place, and time. He appears well-developed and well-nourished.   HENT:   Head: Normocephalic and atraumatic.   Eyes: Pupils are equal, round, and reactive to light.   Neck: Neck supple.   Cardiovascular: Regular rhythm, S1 normal, S2 normal and normal heart sounds.  Tachycardia present.    Pulmonary/Chest: Breath sounds normal. No respiratory distress. He has no wheezes. He has no rales. He exhibits no tenderness.   Abdominal: Soft. He exhibits no distension and no mass. There is tenderness (mild) in the epigastric area. There is no guarding.   Musculoskeletal: Normal range of motion. He exhibits no edema or tenderness.   Neurological: He is alert and oriented to person, place, and time.  No cranial nerve deficit.   Skin: No rash noted.   Psychiatric: He has a normal mood and affect. His behavior is normal. Thought content normal.   Nursing note and vitals reviewed.    Diagnostic Study Results     Labs -      Recent Results (from the past 12 hour(s))   URINALYSIS W/ RFLX MICROSCOPIC    Collection Time: 03/14/16  6:05 AM   Result Value Ref Range    Color YELLOW      Appearance CLEAR      Specific gravity 1.026 1.005 - 1.030      pH (UA) 5.0 5.0 - 8.0      Protein NEGATIVE  NEG mg/dL    Glucose NEGATIVE  NEG mg/dL    Ketone NEGATIVE  NEG mg/dL    Bilirubin NEGATIVE  NEG      Blood NEGATIVE  NEG      Urobilinogen 1.0 0.2 - 1.0 EU/dL    Nitrites NEGATIVE  NEG      Leukocyte Esterase SMALL (A) NEG     URINE MICROSCOPIC ONLY    Collection Time: 03/14/16  6:05 AM   Result Value Ref Range    WBC 4 to 10 0 - 5 /hpf    RBC NEGATIVE  0 - 5 /hpf    Epithelial cells FEW 0 - 5 /lpf    Bacteria RARE NEG /hpf    Mucus 3+ (A) NEG /lpf   CBC WITH AUTOMATED DIFF    Collection Time: 03/14/16  6:20 AM   Result Value Ref Range    WBC 10.0 4.6 - 13.2 K/uL     RBC 5.05 4.70 - 5.50 M/uL    HGB 15.6 13.0 - 16.0 g/dL    HCT 44.2 36.0 - 48.0 %    MCV 87.5 74.0 - 97.0 FL    MCH 30.9 24.0 - 34.0 PG    MCHC 35.3 31.0 - 37.0 g/dL    RDW 12.6 11.6 - 14.5 %    PLATELET 259 135 - 420 K/uL    MPV 10.0 9.2 - 11.8 FL    NEUTROPHILS 56 40 - 73 %    LYMPHOCYTES 31 21 - 52 %    MONOCYTES 10 3 - 10 %    EOSINOPHILS 3 0 - 5 %    BASOPHILS 0 0 - 2 %    ABS. NEUTROPHILS 5.6 1.8 - 8.0 K/UL    ABS. LYMPHOCYTES 3.1 0.9 - 3.6 K/UL    ABS. MONOCYTES 1.0 0.05 - 1.2 K/UL    ABS. EOSINOPHILS 0.3 0.0 - 0.4 K/UL    ABS. BASOPHILS 0.0 0.0 - 0.06 K/UL    DF AUTOMATED     METABOLIC PANEL, COMPREHENSIVE    Collection Time: 03/14/16  6:20 AM   Result Value Ref Range    Sodium 138 136 - 145 mmol/L    Potassium 4.0 3.5 - 5.5 mmol/L    Chloride 101 100 - 108 mmol/L    CO2 24 21 - 32 mmol/L    Anion gap 13 3.0 - 18 mmol/L    Glucose 109 (H) 74 - 99 mg/dL    BUN 18 7.0 - 18 MG/DL    Creatinine 0.95 0.6 - 1.3 MG/DL    BUN/Creatinine ratio 19 12 - 20      GFR est AA >60 >60 ml/min/1.39m    GFR est non-AA >60 >60 ml/min/1.714m   Calcium 9.2 8.5 - 10.1 MG/DL  Bilirubin, total 0.9 0.2 - 1.0 MG/DL    ALT (SGPT) 47 16 - 61 U/L    AST (SGOT) 29 15 - 37 U/L    Alk. phosphatase 120 (H) 45 - 117 U/L    Protein, total 8.0 6.4 - 8.2 g/dL    Albumin 3.6 3.4 - 5.0 g/dL    Globulin 4.4 (H) 2.0 - 4.0 g/dL    A-G Ratio 0.8 0.8 - 1.7     LIPASE    Collection Time: 03/14/16  6:20 AM   Result Value Ref Range    Lipase 111 73 - 393 U/L   CARDIAC PANEL,(CK, CKMB & TROPONIN)    Collection Time: 03/14/16  6:20 AM   Result Value Ref Range    CK 160 39 - 308 U/L    CK - MB <1.0 <3.6 ng/ml    CK-MB Index  0.0 - 4.0 %     CALCULATION NOT PERFORMED WHEN RESULT IS BELOW LINEAR LIMIT    Troponin-I, Qt. <0.02 0.00 - 0.06 NG/ML       Radiologic Studies -  The following have been ordered and reviewed:  CT ABD PELV W CONT   Final Result   IMPRESSION:  ??  1.  Small hiatal hernia, with mild thickening of the imaged distal thoracic   esophagus. Findings may pertain to sequela of reflux disease, with or without  associated esophagitis.  2. Normal caliber small and large intestine, to include a normal appendix.  3. Nonobstructing punctate interpolar left renal stone.    As read by the radiologist.   XR CHEST PORT    (Results Pending)       RADIOLOGY FINDINGS  Chest X-ray shows no PTX or infiltrate  Pending review by Radiologist  Recorded by Phillis Knack , ED Scribe, as dictated by Smitty Knudsen, MD      Medical Decision Making   I am the first provider for this patient.     I reviewed the vital signs, available nursing notes, past medical history, past surgical history, family history and social history.     Vital Signs-Reviewed the patient's vital signs.   Patient Vitals for the past 12 hrs:   Temp Pulse Resp BP SpO2   03/14/16 0728 - 60 18 - -   03/14/16 0725 - 60 - 133/78 100 %   03/14/16 0624 - - - 140/75 -   03/14/16 0555 98.4 ??F (36.9 ??C) 81 18 146/84 99 %       Pulse Oximetry Analysis - Normal 99% on RA     Cardiac Monitor:   Rate: 81 bpm  Rhythm: Normal Sinus Rhythm     Old Medical Records: Nursing notes.     Initial impression is vomiting. Need to rule out gastritis, PUD, GERD, upper GI bleed, gallbladder, pancreatitis, liver disease, colitis, diverticulitis .      Procedures:   Procedures    PROCEDURE NOTE - RECTAL EXAM:   6:12 AM  Performed by: Smitty Knudsen, MD   Rectal exam performed.  brown stool was collected.  Stool was Hemoccult tested, and found to be heme Positive.   The procedure took 1-15 minutes, and pt tolerated well.  Written by Phillis Knack, ED Scribe, as dictated by Smitty Knudsen, MD .     ED Course:  6:06 AM  Initial assessment performed. The patients presenting problems have been discussed, and they are in agreement with the care plan formulated and outlined with them.  I have encouraged them to  ask questions as they arise throughout their visit.    SIGN OUT:  7:15 AM   Patient's presentation, labs/imaging and plan of care was reviewed with Dr. Gardiner Sleeper as part of sign out.  They will review pending results and reassess pt as part of the plan discussed with the patient.    Dr. Wells Guiles Luiza Carranco's assistance in completion of this plan is greatly appreciated but it should be noted that I will be the provider of record for this patient.    Josphat Musapatike, MD        Medications Given in the ED:  Medications   GI COCKTAIL [facility] CMPD) (30 mL Oral Given 03/14/16 0728)   ondansetron (ZOFRAN) injection 4 mg (4 mg IntraVENous Given 03/14/16 0647)   sodium chloride 0.9 % bolus infusion 1,000 mL (1,000 mL IntraVENous New Bag 03/14/16 0637)   famotidine (PF) (PEPCID) 20 mg in sodium chloride 0.9 % 10 mL injection (20 mg IntraVENous Given 03/14/16 0637)   iopamidol (ISOVUE 300) 61 % contrast injection 100 mL (100 mL IntraVENous Given 03/14/16 0702)       Discharge Note:  7:37 AM  Pt has been reexamined. Patient has no new complaints, changes, or physical findings.  Care plan outlined and precautions discussed.  Results were reviewed with the patient. All of pt's questions and concerns were addressed. Patient was instructed and agrees to follow up with GI, as well as to return to the ED upon further deterioration. Patient is ready to go home.    Diagnosis   Clinical Impression:   1. Non-intractable vomiting with nausea, unspecified vomiting type    2. Occult GI bleeding    3. Orthostasis    4. Gastroesophageal reflux disease with esophagitis         Discussion:   55 y.o male presenting for evaluation of abdominal pain, nausea, vomiting, and a constellation of sxs consistent with GERD. His vitals are stable, labs are assuring for NAP. Abdominal exam is benign with no concern for acute intraabdominal pathology. CT scan also reassuring for NAP. Symptoms resolved after GI cocktail and Pepcid. Will discharge with PPI instructions for dietary modifications and GI follow up. Return  precautions provided for worsening sxs.    Follow-up Information     Follow up With Details Comments Contact Info    M Everlean Alstrom, MD Call today for GI follow up  [place] [address]  [phone]      [facility] EMERGENCY DEPT Go to As needed, If symptoms worsen [address]  [place] [address]  [phone]          Current Discharge Medication List      START taking these medications    Details   pantoprazole (PROTONIX) 40 mg tablet Take 1 Tab by mouth daily for 20 days.  Qty: 30 Tab, Refills: 0             _______________________________   Attestations:     This note is prepared by Phillis Knack , acting as a Scribe for [employer], MD  on 5:50 AM on 03/14/2016 .    Josphat Musapatike, MD : The scribe's documentation has been prepared under my direction and personally reviewed by me in its entirety.  _______________________________

## 2016-03-14 NOTE — ED Notes (Signed)
Discharge instructions reviewed with patient; instructions, presciption(s), home care, and follow-up reviewed. Patient verbalized an understanding of discharge instructions and prescription usage. Pt in NAD at time of d/c. Pt with even, unlabored respirations at time of d/c. Pt d/c home.

## 2016-08-26 ENCOUNTER — Inpatient Hospital Stay
Admit: 2016-08-26 | Discharge: 2016-08-27 | Disposition: A | Discharge status: Home / Self Care | Payer: Self-pay | Attending: Emergency Medicine

## 2016-08-26 ENCOUNTER — Emergency Department: Admit: 2016-08-26 | Payer: Self-pay

## 2016-08-26 DIAGNOSIS — S43102A Unspecified dislocation of left acromioclavicular joint, initial encounter: Secondary | ICD-10-CM

## 2016-08-26 MED ORDER — HYDROCODONE-ACETAMINOPHEN 5 MG-325 MG TAB
5-325 mg | ORAL_TABLET | ORAL | 0 refills | Status: DC | PRN
Start: 2016-08-26 — End: 2017-03-15

## 2016-08-26 MED ORDER — HYDROCODONE-ACETAMINOPHEN 5 MG-325 MG TAB
5-325 mg | ORAL | Status: AC
Start: 2016-08-26 — End: 2016-08-26
  Administered 2016-08-26: 23:00:00 via ORAL

## 2016-08-26 MED FILL — HYDROCODONE-ACETAMINOPHEN 5 MG-325 MG TAB: 5-325 mg | ORAL | Qty: 1

## 2016-08-26 NOTE — ED Notes (Signed)
I have reviewed discharge instructions with the patient.  The patient verbalized understanding.      Patient armband removed and shredded

## 2016-08-26 NOTE — ED Provider Notes (Signed)
EMERGENCY DEPARTMENT HISTORY AND PHYSICAL EXAM    Date: 08/26/2016  Patient Name: Artist Pais Erazo    History of Presenting Illness     Chief Complaint   Patient presents with   ??? Shoulder Pain         History Provided By: Patient    Chief Complaint: shoulder pain  Duration: 2 Days  Timing:  Acute  Location: left shoulder  Quality: Aching  Severity: Moderate  Modifying Factors: fall  Associated Symptoms: left ankle pain    Additional History (Context):   7:13 PM  JAYVION STEFANSKI is a 56 y.o. male who presents to the emergency department C/O left shoulder pain onset 2 days. Pt reports he was working on roof and slipped off step ladder onto roof causing him to roll onto ground from roof, one story height fall. Pt states he took all the force on his shoulder. Associated sxs include left ankle pain. Pt denies back pain, CP, SOB, abd pain, LOC, and any other sxs or complaints.     PCP: None        Past History     Past Medical History:  Past Medical History:   Diagnosis Date   ??? Scabies        Past Surgical History:  Past Surgical History:   Procedure Laterality Date   ??? HX HERNIA REPAIR         Family History:  History reviewed. No pertinent family history.    Social History:  Social History   Substance Use Topics   ??? Smoking status: Current Every Day Smoker     Packs/day: 0.50     Years: 35.00   ??? Smokeless tobacco: Never Used   ??? Alcohol use No       Allergies:  No Known Allergies      Review of Systems   Review of Systems   Respiratory: Negative for shortness of breath.    Cardiovascular: Negative for chest pain.   Gastrointestinal: Negative for abdominal pain.   Musculoskeletal: Positive for arthralgias. Negative for back pain.   Neurological: Negative for syncope.   All other systems reviewed and are negative.      Physical Exam     Vitals:    08/26/16 1904   BP: 148/83   Pulse: 88   Resp: 16   Temp: 99 ??F (37.2 ??C)   SpO2: 99%   Weight: 93 kg (205 lb)   Height:  (1.778 m)     Physical Exam    Constitutional: He is oriented to person, place, and time. He appears well-developed and well-nourished.   Alert, well appearing   HENT:   Head: Normocephalic and atraumatic.   Eyes: EOM are normal. Pupils are equal, round, and reactive to light.   Neck: Normal range of motion. Neck supple.   c spine non tender, FROM    Cardiovascular: Normal rate, regular rhythm, normal heart sounds and intact distal pulses.    No murmur heard.  Pulmonary/Chest: Effort normal and breath sounds normal. No respiratory distress. He has no wheezes. He has no rales.   Abdominal: Soft. Bowel sounds are normal. He exhibits no distension. There is no tenderness. There is no rebound and no guarding.   abd non tender   Musculoskeletal:        Left shoulder: He exhibits decreased range of motion, tenderness and bony tenderness. He exhibits no swelling, no effusion, no crepitus, no deformity, normal pulse and normal strength.  Left foot: There is tenderness (over heel). There is no swelling, no crepitus, no deformity and no laceration.        Feet:    LUE: no deformity or skin changes, FROM, no crepitus, N/V intact, brisk cap refill, radial pulse 2+     Back non tender, no bruising, swelling or abrasions    Neurological: He is alert and oriented to person, place, and time.   Skin: Skin is warm and dry.   Psychiatric: He has a normal mood and affect. Judgment normal.   Nursing note and vitals reviewed.        Diagnostic Study Results     Labs -   No results found for this or any previous visit (from the past 12 hour(s)).    Radiologic Studies -   7:55 PM  RADIOLOGY FINDINGS  Left shoulder X-ray shows AC separation, no fracture  Pending review by Radiologist  Recorded by Lynett Grimes , ED Scribe, as dictated by Gwen Her, PA-C     7:55 PM  RADIOLOGY FINDINGS  Left calcaneus X-ray shows NAP  Pending review by Radiologist  Recorded by Lynett Grimes , ED Scribe, as dictated by Gwen Her, PA-C    XR SHOULDER LT AP/LAT MIN 2 V    (Results Pending)   XR CALCANEUS LT    (Results Pending)     CT Results  (Last 48 hours)    None        CXR Results  (Last 48 hours)    None          Medications given in the ED-  Medications   HYDROcodone-acetaminophen (NORCO) 5-325 mg per tablet 1 Tab (1 Tab Oral Given 08/26/16 1925)         Medical Decision Making   I am the first provider for this patient.    I reviewed the vital signs, available nursing notes, past medical history, past surgical history, family history and social history.    Vital Signs-Reviewed the patient's vital signs.    Provider Notes (Medical Decision Making):   Fall, shoulder fx, dislocation, heel contusion    Procedures:  Procedures    ED Course:   7:13 PM Initial assessment performed. The patients presenting problems have been discussed, and they are in agreement with the care plan formulated and outlined with them.  I have encouraged them to ask questions as they arise throughout their visit.    Diagnosis and Disposition       DISCHARGE NOTE:  7:55 PM  Weylyn A Petrucelli's  results have been reviewed with him.  He has been counseled regarding his diagnosis, treatment, and plan.  He verbally conveys understanding and agreement of the signs, symptoms, diagnosis, treatment and prognosis and additionally agrees to follow up as discussed.  He also agrees with the care-plan and conveys that all of his questions have been answered.  I have also provided discharge instructions for him that include: educational information regarding their diagnosis and treatment, and list of reasons why they would want to return to the ED prior to their follow-up appointment, should his condition change. He has been provided with education for proper emergency department utilization.     CLINICAL IMPRESSION:    1. Fall, initial encounter    2. Separation of left acromioclavicular joint, initial encounter    3. Contusion of heel, left, initial encounter        PLAN:  1. D/C Home   2.   Discharge Medication List as  of 08/26/2016  7:57 PM      START taking these medications    Details   HYDROcodone-acetaminophen (NORCO) 5-325 mg per tablet Take 1 Tab by mouth every four (4) hours as needed for Pain. Max Daily Amount: 6 Tabs., Print, Disp-10 Tab, R-0           3.   Follow-up Information     Follow up With Details Comments Contact Info    Steva Ready, MD Schedule an appointment as soon as possible for a visit in 2 days  [address]  [address] [place] [address] [place] [address]  [phone]      [facility] EMERGENCY DEPT  As needed, If symptoms worsen [address]  [place] [address]  [phone]        _______________________________    Attestations:  This note is prepared by Lynett Grimes , acting as Scribe for [employer], PA-C .    Gwen Her, PA-C:  The scribe's documentation has been prepared under my direction and personally reviewed by me in its entirety.  I confirm that the note above accurately reflects all work, treatment, procedures, and medical decision making performed by me.  _______________________________

## 2016-08-26 NOTE — ED Triage Notes (Addendum)
Pt states he experienced a fall off a roof 2 days ago, about a one story fall. Pt landed on his left side and is complaining of L shoulder and L ankle pain. States numbness and tingling intermittently in left shoulder. Denies lost of consciousness. Pt ambulated with a slow limp into triage.     Sepsis Screening completed    (  )Patient meets SIRS criteria.  ( xx )Patient does not meet SIRS criteria.      SIRS Criteria is achieved when two or more of the following are present  ? Temperature < 96.8??F (36??C) or > 100.9??F (38.3??C)  ? Heart Rate > 90 beats per minute  ? Respiratory Rate > 20 breaths per minute  ? WBC count > 12,000 or <4,000 or > 10% bands

## 2017-03-15 ENCOUNTER — Inpatient Hospital Stay
Admit: 2017-03-15 | Discharge: 2017-03-15 | Disposition: A | Discharge status: Home / Self Care | Attending: Emergency Medicine

## 2017-03-15 DIAGNOSIS — L299 Pruritus, unspecified: Secondary | ICD-10-CM

## 2017-03-15 MED ORDER — IVERMECTIN 3 MG TAB
3 mg | ORAL_TABLET | Freq: Once | ORAL | 1 refills | Status: AC
Start: 2017-03-15 — End: 2017-03-15

## 2017-03-15 MED ORDER — PERMETHRIN 5 % TOPICAL CREAM
5 % | CUTANEOUS | 0 refills | Status: AC
Start: 2017-03-15 — End: ?

## 2017-03-15 NOTE — ED Provider Notes (Signed)
EMERGENCY DEPARTMENT HISTORY AND PHYSICAL EXAM    Date: 03/15/2017  Patient Name: Jacob Turner    History of Presenting Illness     No chief complaint on file.        History Provided By: Patient    Chief Complaint: rash/scabies  Duration: 2Years  Timing:  Intermittent  Location: generalized  Quality: itching  Severity: Moderate  Modifying Factors: treated by x2 years ago here in the ED  Associated Symptoms: subjective fever (98.1 F in the ED)    Additional History (Context):   12:42 PM  Jacob Turner is a 56 y.o. male with PMHX of scabies who presents to the emergency department C/O rash/scarbies intermittently x3 years. Associated sxs include subjective fever (98.1 F in the ED). Pt reports he has "reoccurring [place] crusted scabies". States he has tried all possible OTC and rx'd treatments with no relief. Notes that he has had the scabies for x3 years and keeps "reinfecting" himself. States the scabies are localized to the bilateral plantar surfaces, bilateral ears, and scalp. Previous treatment was here in the ED on 11/18/14 with Permethrin. Patient is requesting oral treatment. States he is currently homeless and would like generic brand of rx's. Denies hx of HTN, HLD, and DM. Pt denies SOB, chest pain, vomiting, diarrhea, chills, and any other sxs or complaints.     PCP: None        Past History     Past Medical History:  Past Medical History:   Diagnosis Date   ??? Scabies        Past Surgical History:  Past Surgical History:   Procedure Laterality Date   ??? HX HERNIA REPAIR         Family History:  History reviewed. No pertinent family history.    Social History:  Social History     Tobacco Use   ??? Smoking status: Current Every Day Smoker     Packs/day: 0.50     Years: 35.00     Pack years: 17.50   ??? Smokeless tobacco: Never Used   Substance Use Topics   ??? Alcohol use: Yes     Comment: rare   ??? Drug use: No       Allergies:  No Known Allergies      Review of Systems   Review of Systems    Constitutional: Positive for fever (subjective (98.1 F in the ED)). Negative for chills.   Respiratory: Negative for shortness of breath.    Cardiovascular: Negative for chest pain.   Gastrointestinal: Negative for diarrhea and vomiting.   Skin: Positive for rash (scabies).   All other systems reviewed and are negative.      Physical Exam     Vitals:    03/15/17 1206   BP: 106/82   Pulse: 90   Resp: 16   Temp: 98.1 ??F (36.7 ??C)   SpO2: 96%   Weight: 77.1 kg (170 lb)   Height: 5\' 10"  (1.778 m)     Physical Exam   Constitutional: He is oriented to person, place, and time. He appears well-developed and well-nourished.   HENT:   Head: Normocephalic and atraumatic.   Eyes: Conjunctivae and EOM are normal. Pupils are equal, round, and reactive to light.   Neck: Normal range of motion. Neck supple.   Cardiovascular: Normal rate and regular rhythm.   Pulmonary/Chest: Effort normal and breath sounds normal.   Musculoskeletal: Normal range of motion.   Neurological: He is alert and oriented  to person, place, and time.   Skin: Skin is warm and dry.   Few small scabbed areas noted to both lower legs, hands, right ear pinna, & umbilicus, no web space lesions or superficial infection noted   Psychiatric:   Pr slightly anxious & continuously speaking of [place] Scabbies, testing & tx, how they lay eggs, and etc   Nursing note and vitals reviewed.      Diagnostic Study Results     Labs -   No results found for this or any previous visit (from the past 12 hour(s)).    Radiologic Studies -   No orders to display     CT Results  (Last 48 hours)    None        CXR Results  (Last 48 hours)    None          Medications given in the ED-  Medications - No data to display      Medical Decision Making   I am the first provider for this patient.    I reviewed the vital signs, available nursing notes, past medical history, past surgical history, family history and social history.    Vital Signs-Reviewed the patient's vital signs.     Pulse Oximetry Analysis - 96% on RA     Records Reviewed:  Nursing Notes and Old Medical Records    Procedures:  Procedures    ED Course:   12:42 PM  Initial assessment performed. The patients presenting problems have been discussed, and they are in agreement with the care plan formulated and outlined with them.  I have encouraged them to ask questions as they arise throughout their visit.    Diagnosis and Disposition     DISCHARGE NOTE:  1:50 PM  Jacob Turner's  results have been reviewed with him.  He has been counseled regarding his diagnosis, treatment, and plan.  He verbally conveys understanding and agreement of the signs, symptoms, diagnosis, treatment and prognosis and additionally agrees to follow up as discussed.  He also agrees with the care-plan and conveys that all of his questions have been answered.  I have also provided discharge instructions for him that include: educational information regarding their diagnosis and treatment, and list of reasons why they would want to return to the ED prior to their follow-up appointment, should his condition change. He has been provided with education for proper emergency department utilization.     CLINICAL IMPRESSION:    1. Itching    2. Delusions of parasitosis (HCC)        PLAN:  1. D/C Home  2.   Current Discharge Medication List      START taking these medications    Details   ivermectin (STROMECTOL) 3 mg tablet Take 1 Tab by mouth once for 1 dose.  Qty: 2 Tab, Refills: 1      permethrin (ACTICIN) 5 % topical cream Use as directed  Qty: 60 g, Refills: 0           3.   Follow-up Information     Follow up With Specialties Details Why Contact Info    [facility]  Schedule an appointment as soon as possible for a visit in 2 days For primary care follow up [address]  [place], [place] [id number]  [place] [id number]  [phone]    [facility] [facility]  Schedule an appointment as soon as possible for  a visit in 2 days For follow  up  [address]  [place] [address]  [phone]    [facility] EMERGENCY DEPT Emergency Medicine Go to As needed, if symptoms worsen [address]  [place] [address]  [phone]        _______________________________    Attestations:  This note is prepared by Eulis Foster, acting as Scribe for [employer], PA-C.    Layney Gillson, PA-C:  The scribe's documentation has been prepared under my direction and personally reviewed by me in its entirety.  I confirm that the note above accurately reflects all work, treatment, procedures, and medical decision making performed by me.    _______________________________

## 2017-03-15 NOTE — ED Triage Notes (Signed)
Pt reports seen here for rash/scabies, pt states he continues to have scabies, pt states used treatment but did not get rid of scabies completely, pt reports living conditions are unsanitary and unable to change clothes.

## 2017-03-15 NOTE — ED Notes (Signed)
I have reviewed discharge instructions with the patient.  The patient verbalized understanding. Patient armband removed and shredded

## 2021-03-06 ENCOUNTER — Inpatient Hospital Stay
Admit: 2021-03-06 | Discharge: 2021-03-06 | Disposition: A | Discharge status: Home / Self Care | Attending: Emergency Medicine

## 2021-03-06 DIAGNOSIS — Z139 Encounter for screening, unspecified: Secondary | ICD-10-CM

## 2021-03-06 NOTE — ED Provider Notes (Addendum)
ED Provider Notes by Carron Brazen, PA at 03/06/21 5176                Author: Carron Brazen, PA  Service: Emergency Medicine  Author Type: Physician Assistant       Filed: 03/06/21 0835  Date of Service: 03/06/21 0821  Status: Attested           Editor: Carron Brazen, PA (Physician Assistant)  Cosigner: Tomasita Crumble, DO at 03/10/21 1459          Attestation signed by Tomasita Crumble, DO at 03/10/21 1459          FACE-TO-FACE PROGRESS NOTE:   I personally saw and examined the patient.  I reviewed the documentation by the midlevel provider and agree unless otherwise noted in this attestation. I actively participated in and directly observed  any procedures performed by the midlevel provider.  If any separate procedures were performed by me independently without involvement or participation by the midlevel provider (i.e. sedation, CVC placement, intubation, etc.) a separate procedure note  can be found in the documentation by me, please see that note for details on the procedure.         The patient presents from a local plasma donation center for concern of a cardiac murmur. He has no complaints whatsoever and is not sure why he has been referred here and was surprised when he was told to. He states it was a "new person" at the center  who was training and was sent here for clearance      On exam he has no murmurs auscultated whatsoever throughout the auscultation points. I attempted provacative maneuvers (valsalva, leg raise, and hand grip) with no murmurs found. He has no complaints of syncope, chest pain, dyspnea, orthopnea etc. There  are no bruits over the abdomen or over the carotids or subclavian region.          A/P:      1.  Discharge home   2.  Recommended outpatient cardiac/pcp follow up for echocardiogram for completeness.   3.  I see no barrier to the patient donating plasma based on his presentation today, but recommended routine primary care follow up.      I have personally seen and examined  the patient, reviewed the APP's note and agree with findings and plan.   Tomasita Crumble, DO                                    EMERGENCY DEPARTMENT HISTORY AND PHYSICAL EXAM      Date: 03/06/2021   Patient Name: Jacob Turner        History of Presenting Illness        Time Seen: 8:22 AM        Chief Complaint       Patient presents with        ?  Cardiac Testing           History Provided By: Patient      Additional History (Context):    Jacob Turner is a 60 y.o. male who presents to the emergency room with c/o "cardiac evaluation".  Patient was attempting to give plasma today when he was told that he had a new murmur.  For that reason, he came here to the emergency room to be evaluated.   He is never been told  that he has had a cardiac murmur.  He does not have a primary care provider.  He denies any chest pain or shortness of breath.  No swelling in his legs.  Good energy levels.  Patient has donated plasma numerous times in the past  and has never been told he had any issues with his heart.  States this was a new company evaluating him today.      PCP: None        Current Outpatient Medications          Medication  Sig  Dispense  Refill           ?  permethrin (ACTICIN) 5 % topical cream  Use as directed  60 g  0             Past History        Past Medical History:     Past Medical History:        Diagnosis  Date         ?  Scabies             Past Surgical History:     Past Surgical History:         Procedure  Laterality  Date          ?  HX HERNIA REPAIR               Family History:   No family history on file.      Social History:     Social History          Tobacco Use         ?  Smoking status:  Every Day              Packs/day:  0.50         Years:  35.00         Pack years:  17.50         Types:  Cigarettes         ?  Smokeless tobacco:  Never       Substance Use Topics         ?  Alcohol use:  Yes             Comment: rare         ?  Drug use:  No           Allergies:   No Known Allergies            Review of Systems      Review of Systems    Respiratory:  Negative for chest tightness and shortness of breath.     Cardiovascular:  Negative for chest pain, palpitations and leg swelling.    Skin:  Negative for color change and pallor.    Neurological:  Negative for dizziness, syncope and light-headedness.    All other systems reviewed and are negative.        Physical Exam          Vitals:          03/06/21 0759        BP:  (!) 161/88     Pulse:  68     Resp:  14     Temp:  97.8 ??F (36.6 ??C)     SpO2:  100%        Weight:  77.1 kg (170 lb)        Physical Exam  Vitals and nursing note reviewed.    Constitutional:        General: He is not in acute distress.      Appearance: Normal appearance. He is normal weight. He is not ill-appearing or diaphoretic.     Cardiovascular:       Rate and Rhythm: Normal rate and regular rhythm.       Pulses: Normal pulses.       Heart sounds: Normal heart sounds. No murmur heard.   Pulmonary:       Effort: Pulmonary effort is normal.       Breath sounds: Normal breath sounds.     Abdominal:       General: Bowel sounds are normal.       Palpations: Abdomen is soft.       Tenderness: There is no abdominal tenderness.     Musculoskeletal:       Right lower leg: No edema.       Left lower leg: No edema.     Neurological:       Mental Status: He is alert and oriented to person, place, and time.          Nursing note and vitals reviewed           Diagnostic Study Results        Labs -         Recent Results (from the past 24 hour(s))     EKG, 12 LEAD, INITIAL          Collection Time: 03/06/21  8:11 AM         Result  Value  Ref Range            Ventricular Rate  64  BPM       Atrial Rate  64  BPM       P-R Interval  136  ms       QRS Duration  78  ms       Q-T Interval  394  ms       QTC Calculation (Bezet)  406  ms       Calculated P Axis  53  degrees       Calculated R Axis  23  degrees       Calculated T Axis  48  degrees       Diagnosis                 Normal sinus rhythm    Possible Left atrial enlargement   Borderline ECG   When compared with ECG of 22-Feb-2009 09:56,   No significant change was found              Radiologic Studies      No orders to display          CT Results  (Last 48 hours)             None                    CXR Results  (Last 48 hours)             None                          Medical Decision Making     I am the first provider for this patient.      I reviewed the vital signs, available nursing notes, past medical history,  past surgical history, family history and social history.      Vital Signs-Reviewed the patient's vital signs.      Pulse Oximetry Analysis 100 % on room air.       Records Reviewed: Nursing Notes      DDX : ?? Valvular disease      Procedures:   Procedures      ED Course:    Initial assessment performed. The patients presenting problems have been discussed, and they are in agreement with the care plan formulated and outlined with them.  I have encouraged them to ask questions as they arise throughout their visit.             ED Physician Discussion Note:    Did not hear any murmur.  We will have Dr. Jimmey Ralph go in and see patient as well.  Ultimately, patient may need to be referred to cardiology for echocardiogram evaluation.      Patient was also evaluated by Dr. Jimmey Ralph who also does not hear a murmur.      Paperwork was filled out by Dr. Jimmey Ralph stating patient could donate plasma.      Patient will be given a referral to see cardiologist for possible echocardiogram to be further evaluated for possible murmur/cardiac disease.            Jacob Luster PA-C, am the primary clinician on record for this patient.            Diagnosis and Disposition           DISCHARGE NOTE:   Jacob Turner's  results have been reviewed with him.  He has been counseled regarding his diagnosis, treatment, and plan.  He verbally conveys understanding and agreement of the signs, symptoms,  diagnosis, treatment and prognosis and additionally agrees to follow up as  discussed.  He also agrees with the care-plan and conveys that all of his questions have been answered.  I have also provided discharge instructions for him that include: educational  information regarding their diagnosis and treatment, and list of reasons why they would want to return to the ED prior to their follow-up appointment, should his condition change. He has been provided with education for proper emergency department utilization.       CLINICAL IMPRESSION:         1.  Encounter for medical screening examination            PLAN:   1. D/C Home   2.      Current Discharge Medication List               3.      Follow-up Information                  Follow up With  Specialties  Details  Why  Contact Info              Laveda Norman, MD  Cardiovascular Disease Physician  Go to     [address] [facility]   [address] [place] [address]   [phone]                     ____________________________________       Please note that this dictation was completed with Dragon, the computer voice recognition software.  Quite often unanticipated grammatical, syntax, homophones, and other interpretive errors are inadvertently transcribed by the computer software.  Please  disregard these errors.  Please excuse any errors that have escaped final proofreading.

## 2021-03-06 NOTE — ED Notes (Signed)
Pt presents to rule out possible murmur that may have been heard at plasma center Pt denies history. Denies symptoms

## 2021-03-06 NOTE — ED Notes (Signed)
Patient armband removed and shredded  I have reviewed discharge instructions with the patient.  The patient verbalized understanding.

## 2021-03-13 LAB — EKG 12-LEAD
Atrial Rate: 64 {beats}/min
Diagnosis: NORMAL
P Axis: 53 degrees
P-R Interval: 136 ms
Q-T Interval: 394 ms
QRS Duration: 78 ms
QTc Calculation (Bazett): 406 ms
R Axis: 23 degrees
T Axis: 48 degrees
Ventricular Rate: 64 {beats}/min

## 2021-03-13 LAB — EKG, 12 LEAD, INITIAL
Atrial Rate: 64 {beats}/min
Calculated P Axis: 53 degrees
Calculated R Axis: 23 degrees
Calculated T Axis: 48 degrees
Diagnosis: NORMAL
P-R Interval: 136 ms
Q-T Interval: 394 ms
QRS Duration: 78 ms
QTC Calculation (Bezet): 406 ms
Ventricular Rate: 64 {beats}/min

## 2021-08-28 ENCOUNTER — Inpatient Hospital Stay
Admit: 2021-08-28 | Discharge: 2021-08-28 | Disposition: A | Discharge status: Home / Self Care | Attending: Emergency Medicine

## 2021-08-28 ENCOUNTER — Emergency Department: Admit: 2021-08-28

## 2021-08-28 DIAGNOSIS — J189 Pneumonia, unspecified organism: Secondary | ICD-10-CM

## 2021-08-28 DIAGNOSIS — J209 Acute bronchitis, unspecified: Secondary | ICD-10-CM

## 2021-08-28 LAB — CBC WITH AUTO DIFFERENTIAL
Absolute Immature Granulocyte: 0 10*3/uL (ref 0.00–0.04)
Basophils %: 0 % (ref 0–2)
Basophils Absolute: 0 10*3/uL (ref 0.0–0.1)
Eosinophils %: 5 % (ref 0–5)
Eosinophils Absolute: 0.4 10*3/uL (ref 0.0–0.4)
Hematocrit: 45 % (ref 36.0–48.0)
Hemoglobin: 15.6 g/dL (ref 13.0–16.0)
Immature Granulocytes: 0 % (ref 0.0–0.5)
Lymphocytes %: 16 % — ABNORMAL LOW (ref 21–52)
Lymphocytes Absolute: 1.2 10*3/uL (ref 0.9–3.6)
MCH: 30.8 PG (ref 24.0–34.0)
MCHC: 34.7 g/dL (ref 31.0–37.0)
MCV: 88.9 FL (ref 78.0–100.0)
MPV: 10.4 FL (ref 9.2–11.8)
Monocytes %: 14 % — ABNORMAL HIGH (ref 3–10)
Monocytes Absolute: 1.1 10*3/uL (ref 0.05–1.2)
Neutrophils %: 65 % (ref 40–73)
Neutrophils Absolute: 5 10*3/uL (ref 1.8–8.0)
Nucleated RBCs: 0 PER 100 WBC
Platelets: 171 10*3/uL (ref 135–420)
RBC: 5.06 M/uL (ref 4.35–5.65)
RDW: 12.6 % (ref 11.6–14.5)
WBC: 7.7 10*3/uL (ref 4.6–13.2)
nRBC: 0 10*3/uL (ref 0.00–0.01)

## 2021-08-28 LAB — COMPREHENSIVE METABOLIC PANEL
ALT: 41 U/L (ref 16–61)
AST: 34 U/L (ref 10–38)
Albumin/Globulin Ratio: 1.1 (ref 0.8–1.7)
Albumin: 2.9 g/dL — ABNORMAL LOW (ref 3.4–5.0)
Alk Phosphatase: 95 U/L (ref 45–117)
Anion Gap: 5 mmol/L (ref 3.0–18)
BUN: 14 MG/DL (ref 7.0–18)
Bun/Cre Ratio: 15 (ref 12–20)
CO2: 25 mmol/L (ref 21–32)
Calcium: 7.8 MG/DL — ABNORMAL LOW (ref 8.5–10.1)
Chloride: 111 mmol/L (ref 100–111)
Creatinine: 0.94 MG/DL (ref 0.6–1.3)
Est, Glom Filt Rate: >60 mL/min/{1.73_m2} (ref >60)
Globulin: 2.6 g/dL (ref 2.0–4.0)
Glucose: 117 mg/dL — ABNORMAL HIGH (ref 74–99)
Potassium: 4 mmol/L (ref 3.5–5.5)
Sodium: 141 mmol/L (ref 136–145)
Total Bilirubin: 0.5 MG/DL (ref 0.2–1.0)
Total Protein: 5.5 g/dL — ABNORMAL LOW (ref 6.4–8.2)

## 2021-08-28 LAB — MAGNESIUM: Magnesium: 2 mg/dL (ref 1.6–2.6)

## 2021-08-28 LAB — COVID-19 & INFLUENZA COMBO
Rapid Influenza A By PCR: NOT DETECTED
Rapid Influenza B By PCR: NOT DETECTED
SARS-CoV-2, PCR: NOT DETECTED

## 2021-08-28 LAB — D-DIMER, QUANTITATIVE: D-Dimer, Quant: 0.44 ug/ml(FEU) (ref <0.46)

## 2021-08-28 LAB — TROPONIN: Troponin, High Sensitivity: 12 ng/L (ref 0–78)

## 2021-08-28 LAB — BRAIN NATRIURETIC PEPTIDE: NT Pro-BNP: 106 PG/ML (ref 0–900)

## 2021-08-28 MED ORDER — DEXAMETHASONE SOD PHOSPHATE PF 10 MG/ML IJ SOLN
10 MG/ML | INTRAMUSCULAR | Status: AC
Start: 2021-08-28 — End: 2021-08-28
  Administered 2021-08-28: 20:00:00 10 mg via INTRAVENOUS

## 2021-08-28 MED ORDER — GUAIFENESIN-CODEINE 100-10 MG/5ML PO SYRP
100-10 MG/5ML | Freq: Three times a day (TID) | ORAL | 0 refills | Status: AC | PRN
Start: 2021-08-28 — End: 2021-08-31

## 2021-08-28 MED ORDER — IPRATROPIUM-ALBUTEROL 0.5-2.5 (3) MG/3ML IN SOLN
RESPIRATORY_TRACT | Status: AC
Start: 2021-08-28 — End: 2021-08-28
  Administered 2021-08-28: 20:00:00 1 via RESPIRATORY_TRACT

## 2021-08-28 MED ORDER — DOXYCYCLINE HYCLATE 100 MG PO TABS
100 MG | ORAL_TABLET | Freq: Two times a day (BID) | ORAL | 0 refills | Status: AC
Start: 2021-08-28 — End: 2021-09-07

## 2021-08-28 MED ORDER — IPRATROPIUM-ALBUTEROL 0.5-2.5 (3) MG/3ML IN SOLN
Freq: Once | RESPIRATORY_TRACT | Status: AC
Start: 2021-08-28 — End: 2021-08-28
  Administered 2021-08-28: 20:00:00 1 via RESPIRATORY_TRACT

## 2021-08-28 MED ORDER — PREDNISONE 10 MG PO TABS
10 MG | ORAL_TABLET | ORAL | 0 refills | Status: AC
Start: 2021-08-28 — End: ?

## 2021-08-28 MED ORDER — ALBUTEROL SULFATE HFA 108 (90 BASE) MCG/ACT IN AERS
108 (90 Base) MCG/ACT | Freq: Four times a day (QID) | RESPIRATORY_TRACT | 0 refills | Status: AC | PRN
Start: 2021-08-28 — End: ?

## 2021-08-28 MED ORDER — CEFTRIAXONE SODIUM 1 G IJ SOLR
1 g | INTRAMUSCULAR | Status: DC
Start: 2021-08-28 — End: 2021-08-28
  Administered 2021-08-28: 20:00:00 1000 mg via INTRAVENOUS

## 2021-08-28 MED FILL — IPRATROPIUM-ALBUTEROL 0.5-2.5 (3) MG/3ML IN SOLN: RESPIRATORY_TRACT | Qty: 3

## 2021-08-28 MED FILL — CEFTRIAXONE SODIUM 1 G IJ SOLR: 1 g | INTRAMUSCULAR | Qty: 1000

## 2021-08-28 MED FILL — DEXAMETHASONE SOD PHOSPHATE PF 10 MG/ML IJ SOLN: 10 MG/ML | INTRAMUSCULAR | Qty: 1

## 2021-08-28 NOTE — ED Provider Notes (Signed)
[facility] EMERGENCY DEPT  EMERGENCY DEPARTMENT ENCOUNTER       Pt Name: Jacob Turner  MRN: [id number]  [place] 26-Jan-1961  Date of evaluation: 08/28/2021  PCP: None None  Note Started: 9:43 PM 08/28/21     CHIEF COMPLAINT       Chief Complaint   Patient presents with    Shortness of Breath    Chest Pain        HISTORY OF PRESENT ILLNESS: 1 or more elements      History From: Patient  HPI Limitations: None  Chronic Conditions: none  Social Determinants affecting Dx or Tx: none      OSHAY Turner is a 61 y.o. male who presents to ED c/o SOB. Associated sxs are cough, congestion, and chest pressure. Pt notes dry cough. He is active smoker with formal dx of COPD but he has not seen MD in many years. No relief with OTC meds. Pt is not vaccinated for COVID-19. No cardiac hx. Pt notes exposure to PNA.      Nursing Notes were all reviewed and agreed with or any disagreements were addressed in the HPI.    PAST HISTORY     Past Medical History:  Past Medical History:   Diagnosis Date    Scabies        Past Surgical History:  Past Surgical History:   Procedure Laterality Date    HERNIA REPAIR         Family History:  No family history on file.    Social History:  Social History     Socioeconomic History    Marital status: Single   Tobacco Use    Smoking status: Every Day     Packs/day: 0.50     Types: Cigarettes    Smokeless tobacco: Never   Substance and Sexual Activity    Alcohol use: Yes    Drug use: No       Allergies:  No Known Allergies    CURRENT MEDICATIONS      No current facility-administered medications for this encounter.     Current Outpatient Medications   Medication Sig Dispense Refill    albuterol sulfate HFA (PROVENTIL;VENTOLIN;PROAIR) 108 (90 Base) MCG/ACT inhaler Inhale 2 puffs into the lungs every 6 hours as needed for Wheezing 18 g 0    predniSONE (DELTASONE) 10 MG tablet Days 1-4: 20 mg PO before breakfast, Days 5-8: 15 mg PO before breakfast, Days 9-12: 10 mg PO before breakfast, Days 13-17: 5 mg PO  before breakfast, Days 18-19: 2.5 mg per day 21 tablet 0    doxycycline hyclate (VIBRA-TABS) 100 MG tablet Take 1 tablet by mouth 2 times daily for 10 days 20 tablet 0    guaiFENesin-codeine (TUSSI-ORGANIDIN NR) 100-10 MG/5ML syrup Take 5 mLs by mouth 3 times daily as needed for Cough for up to 3 days. Max Daily Amount: 15 mLs 118 mL 0    permethrin (ELIMITE) 5 % cream Use as directed            PHYSICAL EXAM      Vitals:    08/28/21 1425 08/28/21 1427 08/28/21 1715   BP: (!) 153/77  (!) 150/78   Pulse: 84  87   Resp: 22  18   Temp: 97.3 F (36.3 C)     TempSrc: Oral     SpO2: 96% 98% 97%   Weight: 198 lb (89.8 kg)     Height: 5' 10"  (1.778 m)  Physical Exam  Vitals and nursing note reviewed.   Constitutional:       General: He is not in acute distress.     Appearance: Normal appearance.      Comments: Caucasian male in NAD. Alert. Mild resp distress.    HENT:      Head: Normocephalic and atraumatic.      Right Ear: External ear normal. Tympanic membrane is not erythematous.      Left Ear: External ear normal. Tympanic membrane is not erythematous.      Nose: Congestion present. No rhinorrhea.      Mouth/Throat:      Mouth: Mucous membranes are moist.      Pharynx: Uvula midline.   Eyes:      Conjunctiva/sclera: Conjunctivae normal.   Cardiovascular:      Rate and Rhythm: Normal rate and regular rhythm.      Heart sounds: Normal heart sounds. No murmur heard.    No friction rub. No gallop.   Pulmonary:      Effort: Tachypnea and respiratory distress present. No accessory muscle usage.      Breath sounds: Examination of the right-upper field reveals wheezing. Examination of the left-upper field reveals wheezing. Examination of the right-middle field reveals wheezing. Examination of the left-middle field reveals wheezing. Examination of the right-lower field reveals wheezing. Examination of the left-lower field reveals wheezing. Wheezing present. No decreased breath sounds, rhonchi or rales.   Musculoskeletal:          General: Normal range of motion.      Cervical back: Normal range of motion.      Right lower leg: No edema.      Left lower leg: No edema.   Skin:     Findings: No rash.   Neurological:      Mental Status: He is alert and oriented to person, place, and time.   Psychiatric:         Behavior: Behavior normal.         Judgment: Judgment normal.            DIAGNOSTIC RESULTS   LABS:    Recent Results (from the past 24 hour(s))   EKG 12 Lead    Collection Time: 08/28/21  2:45 PM   Result Value Ref Range    Ventricular Rate 66 BPM    Atrial Rate 66 BPM    P-R Interval 136 ms    QRS Duration 76 ms    Q-T Interval 394 ms    QTc Calculation (Bazett) 413 ms    P Axis 60 degrees    R Axis 33 degrees    T Axis 41 degrees    Diagnosis       Poor data quality, interpretation may be adversely affected  Normal sinus rhythm  Normal ECG  When compared with ECG of 06-Mar-2021 08:11,  No significant change was found     CBC with Auto Differential    Collection Time: 08/28/21  2:54 PM   Result Value Ref Range    WBC 7.7 4.6 - 13.2 K/uL    RBC 5.06 4.35 - 5.65 M/uL    Hemoglobin 15.6 13.0 - 16.0 g/dL    Hematocrit 45.0 36.0 - 48.0 %    MCV 88.9 78.0 - 100.0 FL    MCH 30.8 24.0 - 34.0 PG    MCHC 34.7 31.0 - 37.0 g/dL    RDW 12.6 11.6 - 14.5 %    Platelets 171 135 -  420 K/uL    MPV 10.4 9.2 - 11.8 FL    Nucleated RBCs 0.0 0 PER 100 WBC    nRBC 0.00 0.00 - 0.01 K/uL    Seg Neutrophils 65 40 - 73 %    Lymphocytes 16 (L) 21 - 52 %    Monocytes 14 (H) 3 - 10 %    Eosinophils % 5 0 - 5 %    Basophils 0 0 - 2 %    Immature Granulocytes 0 0.0 - 0.5 %    Segs Absolute 5.0 1.8 - 8.0 K/UL    Absolute Lymph # 1.2 0.9 - 3.6 K/UL    Absolute Mono # 1.1 0.05 - 1.2 K/UL    Absolute Eos # 0.4 0.0 - 0.4 K/UL    Basophils Absolute 0.0 0.0 - 0.1 K/UL    Absolute Immature Granulocyte 0.0 0.00 - 0.04 K/UL    Differential Type AUTOMATED     Comprehensive Metabolic Panel    Collection Time: 08/28/21  2:54 PM   Result Value Ref Range    Sodium 141 136 -  145 mmol/L    Potassium 4.0 3.5 - 5.5 mmol/L    Chloride 111 100 - 111 mmol/L    CO2 25 21 - 32 mmol/L    Anion Gap 5 3.0 - 18 mmol/L    Glucose 117 (H) 74 - 99 mg/dL    BUN 14 7.0 - 18 MG/DL    Creatinine 0.94 0.6 - 1.3 MG/DL    Bun/Cre Ratio 15 12 - 20      Est, Glom Filt Rate >60 >60 ml/min/1.77m    Calcium 7.8 (L) 8.5 - 10.1 MG/DL    Total Bilirubin 0.5 0.2 - 1.0 MG/DL    ALT 41 16 - 61 U/L    AST 34 10 - 38 U/L    Alk Phosphatase 95 45 - 117 U/L    Total Protein 5.5 (L) 6.4 - 8.2 g/dL    Albumin 2.9 (L) 3.4 - 5.0 g/dL    Globulin 2.6 2.0 - 4.0 g/dL    Albumin/Globulin Ratio 1.1 0.8 - 1.7     Magnesium    Collection Time: 08/28/21  2:54 PM   Result Value Ref Range    Magnesium 2.0 1.6 - 2.6 mg/dL   Troponin    Collection Time: 08/28/21  2:59 PM   Result Value Ref Range    Troponin, High Sensitivity 12 0 - 78 ng/L   Brain Natriuretic Peptide    Collection Time: 08/28/21  2:59 PM   Result Value Ref Range    NT Pro-BNP 106 0 - 900 PG/ML   D-Dimer, Quantitative    Collection Time: 08/28/21  2:59 PM   Result Value Ref Range    D-Dimer, Quant 0.44 <0.46 ug/ml(FEU)   COVID-19 & Influenza Combo    Collection Time: 08/28/21  3:12 PM    Specimen: Nasopharyngeal   Result Value Ref Range    SARS-CoV-2, PCR Not detected NOTD      Rapid Influenza A By PCR Not detected NOTD      Rapid Influenza B By PCR Not detected NOTD         Labs Reviewed   CBC WITH AUTO DIFFERENTIAL - Abnormal; Notable for the following components:       Result Value    Lymphocytes 16 (*)     Monocytes 14 (*)     All other components within normal limits   COMPREHENSIVE METABOLIC PANEL - Abnormal;  Notable for the following components:    Glucose 117 (*)     Calcium 7.8 (*)     Total Protein 5.5 (*)     Albumin 2.9 (*)     All other components within normal limits   COVID-19 & INFLUENZA COMBO   MAGNESIUM   TROPONIN   BRAIN NATRIURETIC PEPTIDE   D-DIMER, QUANTITATIVE         EKG: When ordered, EKG's are interpreted by the Emergency Department Provider in  the absence of a cardiologist.  Please see their note for interpretation of EKG.     Read by me.      RADIOLOGY:  Non-plain film images such as CT, Ultrasound and MRI are read by the radiologist. Plain radiographic images are visualized and preliminarily interpreted by the ED Provider with the below findings:       Read by me, pending review by radiologist.     Interpretation per the Radiologist below, if available at the time of this note:  XR CHEST PORTABLE   Final Result   Mild patchy airspace opacity in the right mid and lower lung zone   may represent pneumonia.              PROCEDURES   Unless otherwise noted below, none  Procedures         CRITICAL CARE TIME       EMERGENCY DEPARTMENT COURSE and DIFFERENTIAL DIAGNOSIS/MDM   Vitals:    Vitals:    08/28/21 1425 08/28/21 1427 08/28/21 1715   BP: (!) 153/77  (!) 150/78   Pulse: 84  87   Resp: 22  18   Temp: 97.3 F (36.3 C)     TempSrc: Oral     SpO2: 96% 98% 97%   Weight: 198 lb (89.8 kg)     Height: 5' 10"  (1.778 m)         Patient was given the following medications:  Medications   dexamethasone (PF) (DECADRON) injection 10 mg (10 mg IntraVENous Given 08/28/21 1544)   ipratropium-albuterol (DUONEB) nebulizer solution 1 ampule (1 ampule Inhalation Given 08/28/21 1545)   ipratropium-albuterol (DUONEB) nebulizer solution 1 ampule (1 ampule Inhalation Given 08/28/21 1617)           Records Reviewed (source and summary): Nursing notes.        ED COURSE       Medial Decision Making:  DDX: PNA, COPD, CHF, asthma/RAD, bronchitis, PE, COVID, influenza, sinusitis. Doubt cardiac.     Pt feeling better with wheezing resolved after multiple duo nebs and IV steroids. No evidence of ACS/MI. Ddimer negative. COVID/influenza neg. Will tx for CAP. ABX. Prednisone. Smoking cessation. Reasons to RTED discussed with pt. All questions answered. Pt feels comfortable going home at this time. Pt expressed understanding and he agrees with plan.        FINAL IMPRESSION     1. Community  acquired pneumonia of right lower lobe of lung    2. Acute bronchitis with bronchospasm    3. Tobacco use            DISPOSITION/PLAN   DISPOSITION Decision To Discharge 08/28/2021 05:31:41 PM           PATIENT REFERRED TO:  [facility]  [address]  [place] [address],   Phone: [phone]  Schedule an appointment as soon as possible for a visit   As soon as possible, For follow up from the Emergency Department    [facility]  [address],  [place],  Phone: [phone]  Schedule an appointment as soon as possible for a visit   As soon as possible, For follow up from the Emergency Department    [facility] EMERGENCY DEPT  [place]  [phone]    As needed, If symptoms worsen         DISCHARGE MEDICATIONS:     Medication List        START taking these medications      albuterol sulfate HFA 108 (90 Base) MCG/ACT inhaler  Commonly known as: PROVENTIL;VENTOLIN;PROAIR  Inhale 2 puffs into the lungs every 6 hours as needed for Wheezing     doxycycline hyclate 100 MG tablet  Commonly known as: VIBRA-TABS  Take 1 tablet by mouth 2 times daily for 10 days     guaiFENesin-codeine 100-10 MG/5ML syrup  Commonly known as: TUSSI-ORGANIDIN NR  Take 5 mLs by mouth 3 times daily as needed for Cough for up to 3 days. Max Daily Amount: 15 mLs     predniSONE 10 MG tablet  Commonly known as: DELTASONE  Days 1-4: 20 mg PO before breakfast, Days 5-8: 15 mg PO before breakfast, Days 9-12: 10 mg PO before breakfast, Days 13-17: 5 mg PO before breakfast, Days 18-19: 2.5 mg per day            ASK your doctor about these medications      permethrin 5 % cream  Commonly known as: ELIMITE               Where to Get Your Medications        These medications were sent to [id number] - [place], [place] - [address] - P [phone] - F [phone]  [place], [place] [address]      Phone: [phone]   albuterol sulfate HFA 108 (90 Base)  MCG/ACT inhaler  doxycycline hyclate 100 MG tablet  guaiFENesin-codeine 100-10 MG/5ML syrup  predniSONE 10 MG tablet           I am the Primary Clinician of Record.       (Please note that parts of this dictation were completed with voice recognition software. Quite often unanticipated grammatical, syntax, homophones, and other interpretive errors are inadvertently transcribed by the computer software. Please disregards these errors. Please excuse any errors that have escaped final proofreading.)       Richardine Service, PA-C  08/28/21 2152

## 2021-08-28 NOTE — ED Triage Notes (Signed)
Pt arrived with c/o SOB and chest pressure. Pt states "I think I caught something from somebody a few days ago". Pt has had increased SOB and WOB with chest pressure. Pt states he has a persistent cough that is non productive. Pt has labored breathing in triage that worsens with exertion. Pt oxygen saturation is 98% on RA.

## 2021-08-28 NOTE — ED Notes (Signed)
Medication given per [facility] order.     Patient resting on the stretcher at this time.     Chest rise and fall noted. VSS.     No further complaints at this time.     Will continue to monitor according to facility protocol.       Cristie Hem, RN  08/28/21 [phone]

## 2021-08-28 NOTE — ED Notes (Signed)
Paperwork read with patient making note of where to pick up prescriptions (if applicable).     IV taken out (if applicable).     Armband removed and disposed of in shredder.     Patient has no further questions at this time.     Will discharge from system.        Nydia Bouton, RN  08/28/21 [phone]

## 2021-08-31 LAB — EKG 12-LEAD
Atrial Rate: 66 {beats}/min
P Axis: 60 degrees
P-R Interval: 136 ms
Q-T Interval: 394 ms
QRS Duration: 76 ms
QTc Calculation (Bazett): 413 ms
R Axis: 33 degrees
T Axis: 41 degrees
Ventricular Rate: 66 {beats}/min

## 2023-05-04 DIAGNOSIS — G459 Transient cerebral ischemic attack, unspecified: Secondary | ICD-10-CM

## 2023-05-04 DIAGNOSIS — J101 Influenza due to other identified influenza virus with other respiratory manifestations: Principal | ICD-10-CM

## 2023-05-04 NOTE — ED Triage Notes (Signed)
 Patient arrives with c/o SOB and cough x 3 days.

## 2023-05-04 NOTE — ED Provider Notes (Signed)
 [facility] EMERGENCY DEPT  EMERGENCY DEPARTMENT ENCOUNTER    Patient Name: Jacob Turner  MRN: [id number]  Birth Date: Mar 30, 1961  Provider: Francesco Runner, MD  PCP: None, None   Time/Date of evaluation: 11:00 PM EST on 05/04/23    History of Presenting Ill

## 2023-05-05 ENCOUNTER — Emergency Department: Admit: 2023-05-05 | Primary: Family

## 2023-05-05 ENCOUNTER — Inpatient Hospital Stay
Admission: EM | Admit: 2023-05-05 | Discharge: 2023-05-08 | Disposition: A | Discharge status: Home / Self Care | Admitting: Internal Medicine

## 2023-05-05 ENCOUNTER — Inpatient Hospital Stay: Admit: 2023-05-05 | Primary: Family

## 2023-05-05 ENCOUNTER — Inpatient Hospital Stay: Admit: 2023-05-06 | Primary: Family

## 2023-05-05 LAB — COMPREHENSIVE METABOLIC PANEL
ALT: 43 U/L (ref 16–61)
AST: 27 U/L (ref 10–38)
Albumin/Globulin Ratio: 1.1 (ref 0.8–1.7)
Albumin: 3.5 g/dL (ref 3.4–5.0)
Alk Phosphatase: 122 U/L — ABNORMAL HIGH (ref 45–117)
Anion Gap: 4 mmol/L (ref 3.0–18)
BUN/Creatinine Ratio: 12 (ref 12–20)
BUN: 13 mg/dL (ref 7.0–18)
CO2: 25 mmol/L (ref 21–32)
Calcium: 8.7 mg/dL (ref 8.5–10.1)
Chloride: 109 mmol/L (ref 100–111)
Creatinine: 1.12 mg/dL (ref 0.6–1.3)
Est, Glom Filt Rate: 75 mL/min/{1.73_m2} (ref >60)
Globulin: 3.2 g/dL (ref 2.0–4.0)
Glucose: 99 mg/dL (ref 74–99)
Potassium: 4 mmol/L (ref 3.5–5.5)
Sodium: 138 mmol/L (ref 136–145)
Total Bilirubin: 0.7 mg/dL (ref 0.2–1.0)
Total Protein: 6.7 g/dL (ref 6.4–8.2)

## 2023-05-05 LAB — D-DIMER, QUANTITATIVE: D-Dimer, Quant: 0.48 ug{FEU}/mL — ABNORMAL HIGH (ref <0.46)

## 2023-05-05 LAB — CBC WITH AUTO DIFFERENTIAL
Basophils %: 0 % (ref 0–2)
Basophils Absolute: 0 10*3/uL (ref 0.0–0.1)
Eosinophils %: 2 % (ref 0–5)
Eosinophils Absolute: 0.1 10*3/uL (ref 0.0–0.4)
Hematocrit: 43.1 % (ref 36.0–48.0)
Hemoglobin: 14.4 g/dL (ref 13.0–16.0)
Immature Granulocytes %: 0 % (ref 0.0–0.5)
Immature Granulocytes Absolute: 0 10*3/uL (ref 0.00–0.04)
Lymphocytes %: 16 % — ABNORMAL LOW (ref 21–52)
Lymphocytes Absolute: 1.1 10*3/uL (ref 0.9–3.6)
MCH: 30.3 pg (ref 24.0–34.0)
MCHC: 33.4 g/dL (ref 31.0–37.0)
MCV: 90.7 fL (ref 78.0–100.0)
MPV: 11.2 fL (ref 9.2–11.8)
Monocytes %: 15 % — ABNORMAL HIGH (ref 3–10)
Monocytes Absolute: 1.1 10*3/uL (ref 0.05–1.2)
Neutrophils %: 67 % (ref 40–73)
Neutrophils Absolute: 4.8 10*3/uL (ref 1.8–8.0)
Nucleated RBCs: 0 /100{WBCs}
Platelets: 192 10*3/uL (ref 135–420)
RBC: 4.75 M/uL (ref 4.35–5.65)
RDW: 12.4 % (ref 11.6–14.5)
WBC: 7.1 10*3/uL (ref 4.6–13.2)
nRBC: 0 10*3/uL (ref 0.00–0.01)

## 2023-05-05 LAB — TROPONIN
Troponin, High Sensitivity: 20 ng/L (ref 0–78)
Troponin, High Sensitivity: 23 ng/L (ref 0–78)

## 2023-05-05 LAB — LIPID PANEL
Chol/HDL Ratio: 4.5 (ref 0–5.0)
Cholesterol, Total: 188 mg/dL (ref <200)
HDL: 42 mg/dL (ref 40–60)
LDL Cholesterol: 106.2 mg/dL — ABNORMAL HIGH (ref 0–100)
Triglycerides: 199 mg/dL — ABNORMAL HIGH (ref <150)
VLDL Cholesterol Calculated: 39.8 mg/dL

## 2023-05-05 LAB — MAGNESIUM: Magnesium: 2 mg/dL (ref 1.6–2.6)

## 2023-05-05 LAB — BRAIN NATRIURETIC PEPTIDE: NT Pro-BNP: 649 pg/mL (ref 0–900)

## 2023-05-05 LAB — TSH: TSH, 3rd Generation: 0.8 u[IU]/mL (ref 0.36–3.74)

## 2023-05-05 MED ORDER — ONDANSETRON HCL 4 MG/2ML IJ SOLN
4 MG/2ML | INTRAMUSCULAR | Status: DC | PRN
Start: 2023-05-05 — End: 2023-05-08
  Administered 2023-05-05: 22:00:00 4 mg via INTRAVENOUS

## 2023-05-05 MED ORDER — METOPROLOL TARTRATE 5 MG/5ML IV SOLN
5 MG/ML | INTRAVENOUS | Status: DC | PRN
Start: 2023-05-05 — End: 2023-05-08

## 2023-05-05 MED ORDER — POLYETHYLENE GLYCOL 3350 17 G PO PACK
17 g | Freq: Every day | ORAL | Status: DC | PRN
Start: 2023-05-05 — End: 2023-05-08

## 2023-05-05 MED ORDER — ACETAMINOPHEN 325 MG PO TABS
325 MG | ORAL | Status: DC | PRN
Start: 2023-05-05 — End: 2023-05-08
  Administered 2023-05-06 (×2): 650 mg via ORAL

## 2023-05-05 MED ORDER — BENZONATATE 100 MG PO CAPS
100 MG | Freq: Three times a day (TID) | ORAL | Status: DC | PRN
Start: 2023-05-05 — End: 2023-05-08
  Administered 2023-05-05 – 2023-05-06 (×3): 200 mg via ORAL

## 2023-05-05 MED ORDER — ENOXAPARIN SODIUM 100 MG/ML IJ SOSY
100 | Freq: Two times a day (BID) | INTRAMUSCULAR | Status: DC
Start: 2023-05-05 — End: 2023-05-05

## 2023-05-05 MED ORDER — ONDANSETRON HCL 4 MG/2ML IJ SOLN
4 | INTRAMUSCULAR | Status: AC
Start: 2023-05-05 — End: 2023-05-05
  Administered 2023-05-05: 06:00:00 4 mg via INTRAVENOUS

## 2023-05-05 MED ORDER — POTASSIUM CHLORIDE 10 MEQ/100ML IV SOLN
10 MEQ/0ML | INTRAVENOUS | Status: DC | PRN
Start: 2023-05-05 — End: 2023-05-08

## 2023-05-05 MED ORDER — IOPAMIDOL 76 % IV SOLN
76 | Freq: Once | INTRAVENOUS | Status: AC | PRN
Start: 2023-05-05 — End: 2023-05-05
  Administered 2023-05-05: 07:00:00 75 mL via INTRAVENOUS

## 2023-05-05 MED ORDER — MAGNESIUM SULFATE 2000 MG/50 ML IVPB PREMIX
2 GM/50ML | INTRAVENOUS | Status: DC | PRN
Start: 2023-05-05 — End: 2023-05-08

## 2023-05-05 MED ORDER — PROMETHAZINE (PHENERGAN) 25 MG 50 ML IVPB
INTRAVENOUS | Status: AC
Start: 2023-05-05 — End: 2023-05-05
  Administered 2023-05-05: 07:00:00 25 mg via INTRAVENOUS

## 2023-05-05 MED ORDER — APIXABAN 5 MG PO TABS
5 MG | Freq: Two times a day (BID) | ORAL | Status: DC
Start: 2023-05-05 — End: 2023-05-08
  Administered 2023-05-05 – 2023-05-06 (×2): 5 mg via ORAL

## 2023-05-05 MED ORDER — NORMAL SALINE FLUSH 0.9 % IV SOLN
0.9 % | Freq: Two times a day (BID) | INTRAVENOUS | Status: DC
Start: 2023-05-05 — End: 2023-05-08
  Administered 2023-05-05 – 2023-05-07 (×4): 10 mL via INTRAVENOUS

## 2023-05-05 MED ORDER — NORMAL SALINE FLUSH 0.9 % IV SOLN
0.9 % | INTRAVENOUS | Status: DC | PRN
Start: 2023-05-05 — End: 2023-05-08

## 2023-05-05 MED ORDER — GADOBENATE DIMEGLUMINE 529 MG/ML IV SOLN
529 | Freq: Once | INTRAVENOUS | Status: AC | PRN
Start: 2023-05-05 — End: 2023-05-05
  Administered 2023-05-05: 17:00:00 20 mL via INTRAVENOUS

## 2023-05-05 MED ORDER — METOPROLOL TARTRATE 25 MG PO TABS
25 | Freq: Four times a day (QID) | ORAL | Status: DC
Start: 2023-05-05 — End: 2023-05-05
  Administered 2023-05-05: 23:00:00 12.5 mg via ORAL

## 2023-05-05 MED ORDER — METOPROLOL TARTRATE 25 MG PO TABS
25 | Freq: Two times a day (BID) | ORAL | Status: DC
Start: 2023-05-05 — End: 2023-05-07
  Administered 2023-05-06 – 2023-05-07 (×4): 25 mg via ORAL

## 2023-05-05 MED ORDER — POTASSIUM CHLORIDE CRYS ER 20 MEQ PO TBCR
20 MEQ | ORAL | Status: DC | PRN
Start: 2023-05-05 — End: 2023-05-08

## 2023-05-05 MED ORDER — POTASSIUM BICARB-CITRIC ACID 20 MEQ PO TBEF
20 MEQ | ORAL | Status: DC | PRN
Start: 2023-05-05 — End: 2023-05-08

## 2023-05-05 MED ORDER — ENOXAPARIN SODIUM 100 MG/ML IJ SOSY
100 | INTRAMUSCULAR | Status: AC
Start: 2023-05-05 — End: 2023-05-05
  Administered 2023-05-05: 09:00:00 90 mg/kg via SUBCUTANEOUS

## 2023-05-05 MED FILL — BENZONATATE 100 MG PO CAPS: 100 MG | ORAL | Qty: 2

## 2023-05-05 MED FILL — ONDANSETRON HCL 4 MG/2ML IJ SOLN: 4 MG/2ML | INTRAMUSCULAR | Qty: 2

## 2023-05-05 MED FILL — LOVENOX 100 MG/ML IJ SOSY: 100 MG/ML | INTRAMUSCULAR | Qty: 1

## 2023-05-05 MED FILL — PROMETHAZINE HCL 25 MG/ML IJ SOLN: 25 MG/ML | INTRAMUSCULAR | Qty: 1

## 2023-05-05 MED FILL — ELIQUIS 5 MG PO TABS: 5 MG | ORAL | Qty: 1

## 2023-05-05 MED FILL — BD POSIFLUSH 0.9 % IV SOLN: 0.9 % | INTRAVENOUS | Qty: 40

## 2023-05-05 MED FILL — METOPROLOL TARTRATE 25 MG PO TABS: 25 MG | ORAL | Qty: 1

## 2023-05-05 MED FILL — ISOVUE-370 76 % IV SOLN: 76 % | INTRAVENOUS | Qty: 75

## 2023-05-05 MED FILL — MULTIHANCE 529 MG/ML IV SOLN: 529 MG/ML | INTRAVENOUS | Qty: 20

## 2023-05-05 NOTE — Progress Notes (Signed)
 Patient had a deep persistent cough that caused motion throughout the scan.   Shortly after contrast administration the patients cough caused him to gag and vomit in the scanner.   Patient stated that his cough has been strong enough to promote emesis whil

## 2023-05-05 NOTE — Progress Notes (Signed)
 Off unit to Radiology for MRI

## 2023-05-05 NOTE — Progress Notes (Signed)
 Updates to H&P  Patient complains of cough but nothing is coming up.  He is currently on room air.  He complains of nausea and vomiting.  He also complains of shortness of breath but overall improved since admission.  Telemetry revealed sinus rhythm with P

## 2023-05-05 NOTE — Care Coordination-Inpatient (Signed)
 05/05/23 1230   Service Assessment   Patient Orientation Alert and Oriented   Cognition Alert   History Provided By Patient   Primary Caregiver Self   Support Systems None   PCP Verified by CM No  (patient states he comes to ED for healthcare issues bec

## 2023-05-05 NOTE — Plan of Care (Signed)
 Problem: Discharge Planning  Goal: Discharge to home or other facility with appropriate resources  Outcome: Progressing  Flowsheets (Taken 05/05/2023 0445)  Discharge to home or other facility with appropriate resources:   Identify barriers to discharge

## 2023-05-05 NOTE — Plan of Care (Signed)
 Problem: Discharge Planning  Goal: Discharge to home or other facility with appropriate resources  05/05/2023 0618 by Jennings Books, RN  Outcome: Progressing  Flowsheets (Taken 05/05/2023 0445)  Discharge to home or other facility with appropriate res

## 2023-05-05 NOTE — H&P (Addendum)
 History & Physical    Patient: Jacob Turner MRN: [id number]  CSN: [id number]    Date of Birth: 1960/08/08  Age: 62 y.o.  Sex: male      DOA: 05/04/2023    Chief Complaint:   Chief Complaint   Patient presents with    Shortness of Breath    Cough

## 2023-05-05 NOTE — ED Notes (Signed)
 Late entry~ Pt ambulated to the bathroom and back to the room without any complications. Pt reported he vomited in ct and still felt nauseous, provider notified and new orders placed. Medication requested from pharmacy.

## 2023-05-05 NOTE — Consults (Signed)
 NEUROLOGY CONSULTATION NOTE    Patient: Jacob Turner MRN: [id number]  CSN: [id number]    Date of Birth: Mar 09, 1961  Age: 62 y.o.  Sex: male    DOA: 05/04/2023 LOS:  LOS: 0 days        Requesting Physician: Dr. Vincenza Hews  Reason for Consultation: TIA

## 2023-05-05 NOTE — Consults (Signed)
 [facility] Consult Note      Patient: Jacob Turner MRN: [id number]  SSN: [id number]    Date of Birth: [birth year]  Age: 62 y.o.  Sex: male    Date of Consultation: 05/05/2023  Referring Physician: Dr.Massignan   Reason for Consultation: Abnormal EKG    Chief c

## 2023-05-06 ENCOUNTER — Inpatient Hospital Stay: Admit: 2023-05-06 | Primary: Family

## 2023-05-06 LAB — BASIC METABOLIC PANEL
Anion Gap: 6 mmol/L (ref 3.0–18)
BUN/Creatinine Ratio: 10 — ABNORMAL LOW (ref 12–20)
BUN: 13 mg/dL (ref 7.0–18)
CO2: 26 mmol/L (ref 21–32)
Calcium: 8.7 mg/dL (ref 8.5–10.1)
Chloride: 105 mmol/L (ref 100–111)
Creatinine: 1.27 mg/dL (ref 0.6–1.3)
Est, Glom Filt Rate: 65 mL/min/{1.73_m2} (ref >60)
Glucose: 88 mg/dL (ref 74–99)
Potassium: 3.9 mmol/L (ref 3.5–5.5)
Sodium: 137 mmol/L (ref 136–145)

## 2023-05-06 LAB — EKG 12-LEAD
Atrial Rate: 73 {beats}/min
P Axis: 57 degrees
P-R Interval: 130 ms
Q-T Interval: 368 ms
Q-T Interval: 372 ms
QRS Duration: 74 ms
QRS Duration: 78 ms
QTc Calculation (Bazett): 409 ms
QTc Calculation (Bazett): 429 ms
R Axis: 18 degrees
R Axis: 47 degrees
T Axis: 63 degrees
T Axis: 70 degrees
Ventricular Rate: 73 {beats}/min
Ventricular Rate: 82 {beats}/min

## 2023-05-06 LAB — CBC WITH AUTO DIFFERENTIAL
Basophils %: 0 % (ref 0–2)
Basophils Absolute: 0 10*3/uL (ref 0.0–0.1)
Eosinophils %: 1 % (ref 0–5)
Eosinophils Absolute: 0.1 10*3/uL (ref 0.0–0.4)
Hematocrit: 44.7 % (ref 36.0–48.0)
Hemoglobin: 15 g/dL (ref 13.0–16.0)
Immature Granulocytes %: 0 % (ref 0.0–0.5)
Immature Granulocytes Absolute: 0 10*3/uL (ref 0.00–0.04)
Lymphocytes %: 17 % — ABNORMAL LOW (ref 21–52)
Lymphocytes Absolute: 1.3 10*3/uL (ref 0.9–3.6)
MCH: 30.4 pg (ref 24.0–34.0)
MCHC: 33.6 g/dL (ref 31.0–37.0)
MCV: 90.7 fL (ref 78.0–100.0)
MPV: 10.9 fL (ref 9.2–11.8)
Monocytes %: 19 % — ABNORMAL HIGH (ref 3–10)
Monocytes Absolute: 1.4 10*3/uL — ABNORMAL HIGH (ref 0.05–1.2)
Neutrophils %: 62 % (ref 40–73)
Neutrophils Absolute: 4.7 10*3/uL (ref 1.8–8.0)
Nucleated RBCs: 0 /100{WBCs}
Platelets: 187 10*3/uL (ref 135–420)
RBC: 4.93 M/uL (ref 4.35–5.65)
RDW: 12.6 % (ref 11.6–14.5)
WBC: 7.6 10*3/uL (ref 4.6–13.2)
nRBC: 0 10*3/uL (ref 0.00–0.01)

## 2023-05-06 LAB — COVID-19 & INFLUENZA COMBO
Rapid Influenza A By PCR: DETECTED — AB
Rapid Influenza B By PCR: NOT DETECTED
SARS-CoV-2, PCR: NOT DETECTED

## 2023-05-06 LAB — MAGNESIUM: Magnesium: 2.1 mg/dL (ref 1.6–2.6)

## 2023-05-06 MED ORDER — GUAIFENESIN 100 MG/5ML PO LIQD
100 MG/5ML | ORAL | Status: DC | PRN
Start: 2023-05-06 — End: 2023-05-08
  Administered 2023-05-06 – 2023-05-08 (×2): 200 mg via ORAL

## 2023-05-06 MED ORDER — OSELTAMIVIR PHOSPHATE 75 MG PO CAPS
75 MG | Freq: Two times a day (BID) | ORAL | Status: DC
Start: 2023-05-06 — End: 2023-05-08
  Administered 2023-05-08 (×2): 75 mg via ORAL

## 2023-05-06 MED ORDER — IOPAMIDOL 76 % IV SOLN
76 | Freq: Once | INTRAVENOUS | Status: AC | PRN
Start: 2023-05-06 — End: 2023-05-05
  Administered 2023-05-06: 02:00:00 100 mL via INTRAVENOUS

## 2023-05-06 MED ORDER — ENOXAPARIN SODIUM 100 MG/ML IJ SOSY
100 MG/ML | Freq: Two times a day (BID) | INTRAMUSCULAR | Status: DC
Start: 2023-05-06 — End: 2023-05-08

## 2023-05-06 MED ORDER — BENZONATATE 100 MG PO CAPS
100 MG | Freq: Three times a day (TID) | ORAL | Status: DC
Start: 2023-05-06 — End: 2023-05-08
  Administered 2023-05-06 – 2023-05-08 (×7): 100 mg via ORAL

## 2023-05-06 MED ORDER — SODIUM CHLORIDE 0.9 % IV SOLN (MINI-BAG)
0.9 % | Freq: Two times a day (BID) | INTRAVENOUS | Status: DC
Start: 2023-05-06 — End: 2023-05-08
  Administered 2023-05-06 – 2023-05-08 (×4): 100 mg via INTRAVENOUS

## 2023-05-06 MED FILL — ELIQUIS 5 MG PO TABS: 5 MG | ORAL | Qty: 1

## 2023-05-06 MED FILL — ISOVUE-370 76 % IV SOLN: 76 % | INTRAVENOUS | Qty: 100

## 2023-05-06 MED FILL — METOPROLOL TARTRATE 25 MG PO TABS: 25 MG | ORAL | Qty: 1

## 2023-05-06 MED FILL — BENZONATATE 100 MG PO CAPS: 100 MG | ORAL | Qty: 1

## 2023-05-06 MED FILL — ACETAMINOPHEN 325 MG PO TABS: 325 MG | ORAL | Qty: 2

## 2023-05-06 MED FILL — DOXYCYCLINE HYCLATE 100 MG IV SOLR: 100 MG | INTRAVENOUS | Qty: 100

## 2023-05-06 MED FILL — BENZONATATE 100 MG PO CAPS: 100 MG | ORAL | Qty: 2

## 2023-05-06 MED FILL — GUAIFENESIN 100 MG/5ML PO LIQD: 100 MG/5ML | ORAL | Qty: 10

## 2023-05-06 NOTE — Progress Notes (Addendum)
 Hospitalist Progress Note    Patient: Jacob Turner MRN: [id number]  CSN: [id number]    Date of Birth: March 22, 1961  Age: 62 y.o.  Sex: male    DOA: 05/04/2023 LOS:  LOS: 1 day         Total duration of encounter: 2 days      Chief Complaint ;  Admitted

## 2023-05-06 NOTE — Plan of Care (Signed)
 Problem: Discharge Planning  Goal: Discharge to home or other facility with appropriate resources  05/06/2023 0958 by Jovita Kussmaul, RN  Outcome: Progressing  05/06/2023 0534 by Dorris Carnes, RN  Outcome: Progressing  Flowsheets (Taken 05/05/2023 1932

## 2023-05-06 NOTE — Progress Notes (Signed)
 Cardiology Progress Note        Patient: Jacob Turner        Sex: male          DOA: 05/04/2023  Date of Birth:  1960/10/15      Age:  62 y.o.        LOS:  LOS: 1 day      Patient seen and examined, chart reviewed.    Assessm

## 2023-05-06 NOTE — Plan of Care (Signed)
 Problem: Discharge Planning  Goal: Discharge to home or other facility with appropriate resources  Outcome: Progressing  Flowsheets (Taken 05/05/2023 1932)  Discharge to home or other facility with appropriate resources:   Identify barriers to discharge

## 2023-05-07 ENCOUNTER — Inpatient Hospital Stay: Admit: 2023-05-07 | Primary: Family

## 2023-05-07 LAB — ECHO (TTE) COMPLETE (PRN CONTRAST/BUBBLE/STRAIN/3D)
AR Max Velocity PISA: 3.3 m/s
AR PHT: 421.2 ms
AV Area by Peak Velocity: 3.4 cm2
AV Area by VTI: 3.7 cm2
AV Mean Gradient: 5 mm[Hg]
AV Mean Velocity: 1.1 m/s
AV Peak Gradient: 7 mm[Hg]
AV Peak Velocity: 1.3 m/s
AV VTI: 24.1 cm
AV Velocity Ratio: 0.77
AVA/BSA Peak Velocity: 1.6 cm2/m2
AVA/BSA VTI: 1.8 cm2/m2
Ao Root Index: 1.38 cm/m2
Aortic Root: 2.9 cm
Ascending Aorta Index: 1.43 cm/m2
Ascending Aorta: 3 cm
Body Surface Area: 2.14 m2
E/E' Lateral: 12.29
E/E' Ratio (Averaged): 12.07
E/E' Septal: 11.84
EF BP: 69 % (ref 55–100)
Est. RA Pressure: 3 mm[Hg]
Fractional Shortening 2D: 34 % (ref 28–44)
IVSd: 1.4 cm — AB (ref 0.6–1.0)
LA Diameter: 4.5 cm
LA Size Index: 2.14 cm/m2
LA Volume A-L A4C: 67 mL — AB (ref 18–58)
LA Volume A-L A4C: 72 mL — AB (ref 18–58)
LA Volume A/L: 75 mL
LA Volume BP: 72 mL — AB (ref 18–58)
LA Volume Index A-L A2C: 34 mL/m2 (ref 16–34)
LA Volume Index A-L A4C: 32 mL/m2 (ref 16–34)
LA Volume Index A/L: 36 mL/m2 (ref 16–34)
LA Volume Index BP: 34 mL/m2 (ref 16–34)
LA Volume Index MOD A2C: 33 mL/m2 (ref 16–34)
LA Volume Index MOD A4C: 30 mL/m2 (ref 16–34)
LA Volume MOD A2C: 70 mL — AB (ref 18–58)
LA Volume MOD A4C: 64 mL — AB (ref 18–58)
LA/AO Root Ratio: 1.55
LV E' Lateral Velocity: 6.43 cm/s
LV E' Septal Velocity: 6.67 cm/s
LV EDV A2C: 69 mL
LV EDV A4C: 77 mL
LV EDV BP: 79 mL (ref 67–155)
LV EDV Index A2C: 33 mL/m2
LV EDV Index A4C: 37 mL/m2
LV EDV Index BP: 38 mL/m2
LV ESV A2C: 22 mL
LV ESV A4C: 27 mL
LV ESV BP: 25 mL (ref 22–58)
LV ESV Index A2C: 10 mL/m2
LV ESV Index A4C: 13 mL/m2
LV ESV Index BP: 12 mL/m2
LV Ejection Fraction A2C: 68 %
LV Ejection Fraction A4C: 65 %
LV Mass 2D Index: 119.7 g/m2 — AB (ref 49–115)
LV Mass 2D: 251.4 g — AB (ref 88–224)
LV RWT Ratio: 0.55
LVIDd Index: 2.24 cm/m2
LVIDd: 4.7 cm (ref 4.2–5.9)
LVIDs Index: 1.48 cm/m2
LVIDs: 3.1 cm
LVOT Area: 4.5 cm2
LVOT Diameter: 2.4 cm
LVOT Mean Gradient: 2 mm[Hg]
LVOT Peak Gradient: 4 mm[Hg]
LVOT Peak Velocity: 1 m/s
LVOT SV: 88.6 mL
LVOT Stroke Volume Index: 42.2 mL/m2
LVOT VTI: 19.6 cm
LVOT:AV VTI Index: 0.81
LVPWd: 1.3 cm — AB (ref 0.6–1.0)
MV A Velocity: 0.01 m/s
MV Area by VTI: 3.1 cm2
MV E Velocity: 0.79 m/s
MV E Wave Deceleration Time: 249.5 ms
MV E/A: 79
MV Max Velocity: 1.2 m/s
MV Mean Gradient: 3 mm[Hg]
MV Mean Velocity: 0.8 m/s
MV Peak Gradient: 6 mm[Hg]
MV VTI: 28.5 cm
MV:LVOT VTI Index: 1.45
PASP: 31 mm[Hg]
PV Max Velocity: 1 m/s
PV Mean Gradient: 3 mm[Hg]
PV Mean Velocity: 0.8 m/s
PV Peak Gradient: 4 mm[Hg]
RA Volume Index A4C: 29 mL/m2
RA Volume: 61 mL
RV Free Wall Peak S': 10.1 cm/s
RVIDd: 3.8 cm
RVOT Mean Gradient: 2 mm[Hg]
RVOT Peak Gradient: 3 mm[Hg]
RVOT Peak Velocity: 0.9 m/s
RVOT VTI: 15.3 cm
RVSP: 31 mm[Hg]
TAPSE: 1.6 cm — AB (ref 1.7–?)
TR Max Velocity: 2.65 m/s
TR Peak Gradient: 28 mm[Hg]

## 2023-05-07 MED ORDER — ATORVASTATIN CALCIUM 20 MG PO TABS
20 | Freq: Every evening | ORAL | Status: AC
Start: 2023-05-07 — End: ?
  Administered 2023-05-08: 02:00:00 80 mg via ORAL

## 2023-05-07 MED ORDER — TECHNETIUM TC 99M SESTAMIBI IV KIT
Freq: Once | INTRAVENOUS | Status: AC | PRN
Start: 2023-05-07 — End: 2023-05-07
  Administered 2023-05-07: 14:00:00 9.6 via INTRAVENOUS

## 2023-05-07 MED ORDER — REGADENOSON 0.4 MG/5ML IV SOLN
0.4 | Freq: Once | INTRAVENOUS | Status: AC | PRN
Start: 2023-05-07 — End: 2023-05-07
  Administered 2023-05-07: 17:00:00 0.4 mg via INTRAVENOUS

## 2023-05-07 MED ORDER — CLOPIDOGREL BISULFATE 75 MG PO TABS
75 | Freq: Every day | ORAL | Status: DC
Start: 2023-05-07 — End: 2023-05-07
  Administered 2023-05-07: 22:00:00 75 mg via ORAL

## 2023-05-07 MED ORDER — TECHNETIUM TC 99M SESTAMIBI IV KIT
Freq: Once | INTRAVENOUS | Status: AC | PRN
Start: 2023-05-07 — End: 2023-05-07
  Administered 2023-05-07: 17:00:00 26.5 via INTRAVENOUS

## 2023-05-07 MED ORDER — CALCIUM CARBONATE ANTACID 500 MG PO CHEW
500 MG | Freq: Three times a day (TID) | ORAL | Status: DC | PRN
Start: 2023-05-07 — End: 2023-05-08
  Administered 2023-05-07 – 2023-05-08 (×4): 500 mg via ORAL

## 2023-05-07 MED ORDER — ASPIRIN 81 MG PO TBEC
81 | Freq: Every day | ORAL | Status: DC
Start: 2023-05-07 — End: 2023-05-07
  Administered 2023-05-07: 14:00:00 81 mg via ORAL

## 2023-05-07 MED ORDER — METOPROLOL TARTRATE 50 MG PO TABS
50 | Freq: Two times a day (BID) | ORAL | Status: AC
Start: 2023-05-07 — End: ?
  Administered 2023-05-08 (×2): 50 mg via ORAL

## 2023-05-07 MED FILL — METOPROLOL TARTRATE 25 MG PO TABS: 25 MG | ORAL | Qty: 1

## 2023-05-07 MED FILL — LEXISCAN 0.4 MG/5ML IV SOLN: 0.4 MG/5ML | INTRAVENOUS | Qty: 5

## 2023-05-07 MED FILL — LOVENOX 100 MG/ML IJ SOSY: 100 MG/ML | INTRAMUSCULAR | Qty: 1

## 2023-05-07 MED FILL — CLOPIDOGREL BISULFATE 75 MG PO TABS: 75 MG | ORAL | Qty: 1

## 2023-05-07 MED FILL — CAL-GEST ANTACID 500 MG PO CHEW: 500 MG | ORAL | Qty: 1

## 2023-05-07 MED FILL — BENZONATATE 100 MG PO CAPS: 100 MG | ORAL | Qty: 1

## 2023-05-07 MED FILL — DOXYCYCLINE HYCLATE 100 MG IV SOLR: 100 MG | INTRAVENOUS | Qty: 100

## 2023-05-07 MED FILL — ASPIRIN LOW DOSE 81 MG PO TBEC: 81 MG | ORAL | Qty: 1

## 2023-05-07 NOTE — Progress Notes (Signed)
 Cardiology Progress Note        Patient: Jacob Turner        Sex: male          DOA: 05/04/2023  Date of Birth:  11-23-1960      Age:  62 y.o.        LOS:  LOS: 2 days      Patient seen and examined, chart reviewed.    Assess

## 2023-05-07 NOTE — Plan of Care (Signed)
 Problem: Discharge Planning  Goal: Discharge to home or other facility with appropriate resources  05/07/2023 1153 by Karleen Dolphin, RN  Outcome: Progressing  05/07/2023 0524 by Dorris Carnes, RN  Outcome: Progressing  Flowsheets (Taken 05/06/2023 2002

## 2023-05-07 NOTE — Progress Notes (Addendum)
 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 Cardiology is on board for Atrial fibrillation. Eliquis is on hold and he is on full dose Lovenox now. Pending vascular surgery consult (Perfect

## 2023-05-07 NOTE — Plan of Care (Signed)
 Problem: Discharge Planning  Goal: Discharge to home or other facility with appropriate resources  Outcome: Progressing  Flowsheets (Taken 05/06/2023 2002)  Discharge to home or other facility with appropriate resources:   Identify barriers to discharge

## 2023-05-07 NOTE — Consults (Addendum)
 [facility]    Consultation    Admit date: 05/04/2023  Consult date: @TDR @  Referring provider: Sallye Ober, MD    Clinical Query/ Reason for Consultation: ICA stenosis    Assessment/Plan:   CONNELLY NETTERVILLE is a 62 y.o. male

## 2023-05-07 NOTE — Progress Notes (Addendum)
 Hospitalist Progress Note    Patient: Jacob Turner MRN: [id number]  CSN: [id number]    Date of Birth: 1960-07-06  Age: 62 y.o.  Sex: male    DOA: 05/04/2023 LOS:  LOS: 2 days         Total duration of encounter: 3 days      Chief Complaint ;  Admitted

## 2023-05-07 NOTE — Plan of Care (Signed)
 Problem: Discharge Planning  Goal: Discharge to home or other facility with appropriate resources  05/07/2023 2159 by Darol Destine, RN  Outcome: Progressing  Flowsheets (Taken 05/07/2023 2030)  Discharge to home or other facility with appropriate reso

## 2023-05-07 NOTE — Progress Notes (Addendum)
 Pt noted to be on vascular consult list this morning however pt was not discussed with vascular team.    If vascular consult is still needed, will see pt if/when team is directly contacted.    Maricela Bo NP  Office [phone], Telepage [phone] and

## 2023-05-08 LAB — CBC
Hematocrit: 49.9 % — ABNORMAL HIGH (ref 36.0–48.0)
Hemoglobin: 16.7 g/dL — ABNORMAL HIGH (ref 13.0–16.0)
MCH: 30.1 pg (ref 24.0–34.0)
MCHC: 33.5 g/dL (ref 31.0–37.0)
MCV: 89.9 fL (ref 78.0–100.0)
MPV: 10.7 fL (ref 9.2–11.8)
Nucleated RBCs: 0 /100{WBCs}
Platelets: 192 10*3/uL (ref 135–420)
RBC: 5.55 M/uL (ref 4.35–5.65)
RDW: 12.5 % (ref 11.6–14.5)
WBC: 6.8 10*3/uL (ref 4.6–13.2)
nRBC: 0 10*3/uL (ref 0.00–0.01)

## 2023-05-08 LAB — COMPREHENSIVE METABOLIC PANEL
ALT: 56 U/L (ref 16–61)
AST: 41 U/L — ABNORMAL HIGH (ref 10–38)
Albumin/Globulin Ratio: 0.8 (ref 0.8–1.7)
Albumin: 3.4 g/dL (ref 3.4–5.0)
Alk Phosphatase: 112 U/L (ref 45–117)
Anion Gap: 6 mmol/L (ref 3.0–18)
BUN/Creatinine Ratio: 13 (ref 12–20)
BUN: 14 mg/dL (ref 7.0–18)
CO2: 22 mmol/L (ref 21–32)
Calcium: 9.5 mg/dL (ref 8.5–10.1)
Chloride: 112 mmol/L — ABNORMAL HIGH (ref 100–111)
Creatinine: 1.11 mg/dL (ref 0.6–1.3)
Est, Glom Filt Rate: 76 mL/min/{1.73_m2} (ref >60)
Globulin: 4.1 g/dL — ABNORMAL HIGH (ref 2.0–4.0)
Glucose: 106 mg/dL — ABNORMAL HIGH (ref 74–99)
Potassium: 4 mmol/L (ref 3.5–5.5)
Sodium: 140 mmol/L (ref 136–145)
Total Bilirubin: 0.8 mg/dL (ref 0.2–1.0)
Total Protein: 7.5 g/dL (ref 6.4–8.2)

## 2023-05-08 LAB — PROTIME-INR
INR: 0.9 (ref 0.9–1.1)
Protime: 12.2 s (ref 11.9–14.9)

## 2023-05-08 LAB — NM STRESS TEST WITH MYOCARDIAL PERFUSION
Baseline Diastolic BP: 76 mm[Hg]
Baseline HR: 104 {beats}/min
Baseline Systolic BP: 133 mm[Hg]
Body Surface Area: 2.14 m2
Nuc Stress EF: 55 %
Stress Diastolic BP: 85 mm[Hg]
Stress Estimated Workload: 1 METS
Stress Peak HR: 123 {beats}/min
Stress Percent HR Achieved: 77 %
Stress Rate Pressure Product: 16974 BPM*mmHg
Stress Systolic BP: 138 mm[Hg]
Stress Target HR: 160 {beats}/min
TID: 1.3

## 2023-05-08 LAB — MAGNESIUM: Magnesium: 2 mg/dL (ref 1.6–2.6)

## 2023-05-08 MED ORDER — APIXABAN STARTER PACK 5 MG PO TBPK
5 | ORAL_TABLET | ORAL | 0 refills | Status: DC
Start: 2023-05-08 — End: 2023-07-31
  Filled 2023-05-08: qty 74, 30d supply, fill #0

## 2023-05-08 MED ORDER — ATORVASTATIN CALCIUM 80 MG PO TABS
80 | ORAL_TABLET | Freq: Every evening | ORAL | 3 refills | Status: DC
Start: 2023-05-08 — End: 2023-07-31
  Filled 2023-05-08: qty 30, 30d supply, fill #0

## 2023-05-08 MED ORDER — METOPROLOL TARTRATE 50 MG PO TABS
50 | ORAL_TABLET | Freq: Two times a day (BID) | ORAL | 3 refills | Status: DC
Start: 2023-05-08 — End: 2023-07-31
  Filled 2023-05-08: qty 60, 30d supply, fill #0

## 2023-05-08 MED ORDER — OSELTAMIVIR PHOSPHATE 75 MG PO CAPS
75 | ORAL_CAPSULE | Freq: Two times a day (BID) | ORAL | 0 refills | 10.00000 days | Status: AC
Start: 2023-05-08 — End: 2023-05-11
  Filled 2023-05-08: qty 3, 1d supply, fill #0

## 2023-05-08 MED ORDER — OSELTAMIVIR PHOSPHATE 75 MG PO CAPS
75 | ORAL_CAPSULE | Freq: Two times a day (BID) | ORAL | 0 refills | 10.00000 days | Status: AC
Start: 2023-05-08 — End: 2023-05-12

## 2023-05-08 MED ORDER — CLOPIDOGREL BISULFATE 75 MG PO TABS
75 | ORAL_TABLET | Freq: Every day | ORAL | 3 refills | Status: DC
Start: 2023-05-08 — End: 2023-07-31
  Filled 2023-05-08: qty 30, 30d supply, fill #0

## 2023-05-08 MED ORDER — BENZONATATE 200 MG PO CAPS
200 | ORAL_CAPSULE | Freq: Three times a day (TID) | ORAL | 0 refills | 10.00000 days | Status: AC | PRN
Start: 2023-05-08 — End: 2023-05-15
  Filled 2023-05-08: qty 30, 10d supply, fill #0

## 2023-05-08 MED ORDER — CLOPIDOGREL BISULFATE 75 MG PO TABS
75 | Freq: Every day | ORAL | Status: AC
Start: 2023-05-08 — End: ?
  Administered 2023-05-08: 15:00:00 75 mg via ORAL

## 2023-05-08 MED ORDER — DOXYCYCLINE HYCLATE 100 MG PO TABS
100 | ORAL_TABLET | Freq: Two times a day (BID) | ORAL | 0 refills | 7.00000 days | Status: AC
Start: 2023-05-08 — End: 2023-05-18
  Filled 2023-05-08: qty 20, 10d supply, fill #0

## 2023-05-08 MED ORDER — ASPIRIN 81 MG PO TBEC
81 | Freq: Every day | ORAL | Status: DC
Start: 2023-05-08 — End: 2023-05-07

## 2023-05-08 MED FILL — METOPROLOL TARTRATE 50 MG PO TABS: 50 MG | ORAL | Qty: 1

## 2023-05-08 MED FILL — DOXYCYCLINE HYCLATE 100 MG IV SOLR: 100 MG | INTRAVENOUS | Qty: 100

## 2023-05-08 MED FILL — BENZONATATE 100 MG PO CAPS: 100 MG | ORAL | Qty: 1

## 2023-05-08 MED FILL — CAL-GEST ANTACID 500 MG PO CHEW: 500 MG | ORAL | Qty: 1

## 2023-05-08 MED FILL — ATORVASTATIN CALCIUM 20 MG PO TABS: 20 MG | ORAL | Qty: 4

## 2023-05-08 MED FILL — TAMIFLU 75 MG PO CAPS: 75 MG | ORAL | Qty: 1

## 2023-05-08 MED FILL — GUAIFENESIN 100 MG/5ML PO LIQD: 100 MG/5ML | ORAL | Qty: 10

## 2023-05-08 MED FILL — CLOPIDOGREL BISULFATE 75 MG PO TABS: 75 MG | ORAL | Qty: 1

## 2023-05-08 MED FILL — LOVENOX 100 MG/ML IJ SOSY: 100 MG/ML | INTRAMUSCULAR | Qty: 1

## 2023-05-08 NOTE — Discharge Summary (Signed)
 Discharge Summary    Patient: Jacob Turner               Sex: male          DOA: 05/04/2023         Date of Birth:  1960/11/18      Age:  62 y.o.        LOS:  LOS: 3 days                Admit Date: 05/04/2023    Discharge Date: 05/08/2023    Admission Diagnoses: Atrial fibrillation (HCC) [I48.91]  TIA (transient ischemic attack) [G45.9]  Paroxysmal atrial fibrillation (HCC) [I48.0]    Discharge Diagnoses:    Hospital Problems             Last Modified POA    * (Principal) Atrial fibrillation (HCC) 05/05/2023 Yes    Intracranial atherosclerosis 05/07/2023 Yes        Discharge Condition: Good    Hospital Cours  TIA  Neurology consulted  MRI negative  Intracranial stenosis noted will need to follow up  Vascular consult  done     Afib  new onset  Now on eliquis  heparin stopped   For stress test that showed reversable ischemia  Cath was recommended patient declined he wanted to recover from flu first  He will follow up with cardiology as out patient   Started on Bid metoprolol   Echo pending      Sinusitis  Started on Doxycline  Smoking cessation advised  Check covid and flu flu was positive      Influenza  Started on Tamiflu      Right ICA occlusion   Out patient follow up needs intervention  High dose statin  Plavix  75mg  started     Physical Exam:  BP 119/72   Pulse 58   Temp 97.3 F (36.3 C) (Oral)   Resp 18   Ht 1.778 m (5\' 10" )   Wt 92.5 kg (204 lb)   SpO2 100%   BMI 29.27 kg/m   General appearance: alert, cooperative, no distress, appears stated age  Head: Normocephalic, without obvious abnormality, atraumatic  Neck: supple, trachea midline  Lungs: clear to auscultation bilaterally  Heart: regular rate and rhythm, S1, S2 normal, no murmur, click, rub or gallop  Abdomen: soft, non-tender. Bowel sounds normal. No masses,  no organomegaly  Extremities: extremities normal, atraumatic, no cyanosis or edema  Skin: Skin color, texture, turgor normal. No rashes or lesions  Neurologic: Grossly normal    Labs:  Results:       Chemistry Recent Labs     05/06/23  0531 05/08/23  0506   NA 137 140   K 3.9 4.0   CL 105 112*   CO2 26 22   BUN 13 14   GLOB  --  4.1*      CBC w/Diff Recent Labs     05/06/23  0531 05/08/23  0506   WBC 7.6 6.8   RBC 4.93 5.55   HGB 15.0 16.7*   HCT 44.7 49.9*   PLT 187 192      Cardiac Enzymes No results for input(s): "CPK" in the last 72 hours.    Invalid input(s): "CKRMB", "CKND1", "TROIP", "MYO"   Coagulation Recent Labs     05/08/23  0506   INR 0.9       Lipid Panel Lab Results   Component Value Date/Time    CHOL 188 05/04/2023 11:22 PM  HDL 42 05/04/2023 11:22 PM    LDL 106.2 05/04/2023 11:22 PM    VLDL 39.8 05/04/2023 11:22 PM      BNP Invalid input(s): "BNPP"   Liver Enzymes No results for input(s): "TP" in the last 72 hours.    Invalid input(s): "ALB", "TBIL", "AP", "SGOT", "GPT", "DBIL"   Thyroid Studies Lab Results   Component Value Date/Time    TSH 0.80 05/04/2023 11:22 PM          Consults: Cardiology  Treatment Team:   Thomes Flicker, MD  Lafayette Pierre, PA-C  Forde Idler, RN    Significant Diagnostic Studies: Nuclear stress test with myocardial perfusion    Result Date: 05/08/2023  1) Lexiscan  stress test is positive for ischemia. TID 1.3 2) Baseline EKG revealed atrial fibrillation, non specific T wave changes. There were no significant ST changes after injection of Regadenoson  or in recovery. 3) There was mild reversible perfusion defect of medium size in mid and basal inferior segments. 4) Calculated LVEF 55%. TID 1.3 5) Cannot comment on exercise capacity due to pharmacological study. 6) No previous study to compare     Echo (TTE) complete (PRN contrast/bubble/strain/3D)    Result Date: 05/06/2023    Left Ventricle: Normal left ventricular systolic function with a visually estimated EF of 60 - 65%. EF by 2D Simpsons Biplane is 69%. Left ventricle size is normal. Moderately increased wall thickness. Findings consistent with moderate concentric hypertrophy. Normal wall  motion. Indeterminate diastolic function due to atrial fibrillation. Average E/e' ratio is 12.07.   Right Ventricle: Low normal systolic function. TAPSE is 1.6 cm. RV Peak S' is 10.1 cm/s.   Left Atrium: Left atrium is mildly dilated. LA Vol Index is  34 ml/m2.   Right Atrium: Right atrium is mildly dilated.   Tricuspid Valve: Mild regurgitation. The estimated PASP is 31 mmHg.   Interatrial Septum: No interatrial shunt visualized with color Doppler. Agitated saline study was negative with and without provocation.     MRI BRAIN W WO CONTRAST    Addendum Date: 05/05/2023    Addendum: Occlusion of the cavernous portion of the left ICA, this was not well appreciated due to motion artifact on the examination, demonstrated on follow-up CTA. Electronically signed by Ezra Holmes    Result Date: 05/05/2023  EXAM:  MRI BRAIN W WO CONTRAST Clinical history: Hx of transient loss of vision x 2 episodes and transient weakness of rt arm r/o underlying ishemic infarcts INDICATION:   Hx of transient loss of vision x 2 episodes and transient weakness of rt arm r/o underlying ishemic infarcts COMPARISON: None TECHNIQUE: MR examination of the brain includes axial and sagittal T1 , axial T2, axial FLAIR, axial gradient echo, axial DWI, coronal T1 . Pre and post contrast axial T1-weighted imaging. Postcontrast T1-weighted imaging coronal plane. CONTRAST: ProHance FINDINGS: There is no intracranial mass, hemorrhage or acute infarction. Pontomesencephalic ratio is within normal limits. Mild left and right maxillary sinus disease. IACs are symmetric. Scattered foci of FLAIR signal abnormality are minimal, likely of limited clinical significance. There is a degree of motion artifact which limits evaluation. Otherwise; There is no abnormal parenchymal enhancement. There is no abnormal meningeal enhancement demonstrated. The brain architecture is normal. There is no evidence of midline shift or mass-effect. The ventricles are normal in size,  position and configuration.  There are no extra-axial fluid collections. Major intracranial vascular flow-voids are unremarkable. The orbits are grossly symmetric. Dural venous sinuses are grossly unremarkable. There is  no Chiari or syrinx. Pituitary and infundibulum grossly unremarkable. Cerebellopontine angles are unremarkable. No skull base mass is identified. Cavernous sinuses are symmetric. The mastoid air cells and are well pneumatized and clear.     Normal MRI of the brain. Mild maxillary sinus disease. No intracranial mass, hemorrhage or evidence of acute infarction. Electronically signed by Reford Canterbury HABIB    CTA HEAD NECK W CONTRAST    Result Date: 05/05/2023  EXAM: CTA HEAD NECK W CONTRAST CLINICAL HISTORY: Altered mental status vision loss INDICATION: Vision loss, altered mental status COMPARISON: 05/05/2023 CONTRAST: 100 ml Isovue -370 TECHNIQUE:  Unenhanced scout images were obtained to localize the volume for acquisition. Multi slice helical axial CT angiography was performed from the aortic arch to the top of the head during uneventful rapid bolus intravenous contrast administration.   Coronal and sagittal reformations and 3D post processing was performed.  CT dose reduction was achieved through use of a standardized protocol tailored for this examination and automatic exposure control for dose modulation. Examination was evaluated utilizing the viz. AI algorithm. FINDINGS: CTA NECK Centrilobular emphysema. Sub-4 mm nodule in the left upper lobe.. The left vertebral artery arises directly from the aortic arch. There is occlusion of the common carotid artery on the left. There is moderate to severe stenosis of the origin of the ICA on the right.. % of right carotid artery stenosis: 70-80 % of left carotid artery stenosis: Occluded Measurements utilizing NASCET criteria. NASCET method was utilized for calculating stenosis. Multilevel foraminal stenosis of the cervical spine. Left maxillary and ethmoid sinus  disease. There is right ethmoid sinus disease as well.. The cervical soft tissues are unremarkable. There are degenerative changes of the cervical spine. CTA HEAD Occluded petrous and cavernous ICA on the left. The right vertebral artery is dominant. P1 and proximal P2 segments are patent. There is a near occlusive plaque/thrombus of the left A1. There is a posterior communicating artery on the right and on the left. M1 segments are patent. Symmetric arborization of M2 vessels is demonstrated. The basilar artery is patent. The proximal P1 segments are patent bilaterally.There is no aneurysm. A2 and A3 segments are widely patent.. No evidence of acute intracranial hemorrhage or midline shift is demonstrated.     Likely chronic occlusion of the common carotid artery and internal carotid artery on the left. There is reconstituted flow into the left MCA M1 segment via a small posterior communicating artery and an A1 segment on the left. There is hemodynamically significant stenosis/significant thrombus present in the A1 segment on the left. There is moderate stenosis of the internal carotid artery on the right with irregular plaque and 70-80% stenosis of the proximal ICA. Moderate to severe ethmoid sinus disease. Imaging results were called to the clinical care team at the completion of the study.. No new symptomatology. Nonemergent neurointerventional consultation is recommended. 05/05/2023 11:32 p.m. Electronically signed by Reford Canterbury HABIB    CT HEAD WO CONTRAST    Result Date: 05/05/2023  EXAM: CT HEAD WO CONTRAST INDICATION: new onset a-fib, pending anticoagulation, likely subacute TIAs (3 months ago) COMPARISON: None. CONTRAST: None. TECHNIQUE: Unenhanced CT of the head was performed using 5 mm images. Brain and bone windows were generated. Coronal and sagittal reformats. CT dose reduction was achieved through use of a standardized protocol tailored for this examination and automatic exposure control for dose  modulation.  FINDINGS: The ventricles and sulci are normal in size, shape and configuration. There is no significant white matter disease. There is no intracranial  hemorrhage, extra-axial collection, or mass effect. The basilar cisterns are open. No CT evidence of acute infarct. The bone windows demonstrate no abnormalities. The visualized portions of the paranasal sinuses and mastoid air cells show air-fluid levels and partial opacification of the maxillary sinuses and partial opacification of the ethmoid sinuses but otherwise are clear.     Paranasal sinus disease compatible with acute sinusitis. No acute intracranial findings. Electronically signed by Etha Henle    CTA CHEST W WO CONTRAST    Result Date: 05/05/2023  EXAM:  CTA CHEST W WO CONTRAST INDICATION: Shortness of breath with new onset atrial fibrillation, exertional dyspnea, and elevated D-dimer. COMPARISON: 75 TECHNIQUE: Helical thin section chest CT following intravenous administration of nonionic contrast mL of isovue  370 according to departmental PE protocol. Coronal and sagittal reformats were performed. 3D post processing was performed.  CT dose reduction was achieved through the use of a standardized protocol tailored for this examination and automatic exposure control for dose modulation. FINDINGS: This is a good quality study for the evaluation of pulmonary embolism to the first subsegmental arterial level. There is no pulmonary embolism to this level. MEDIASTINUM: No mass or lymphadenopathy. HILA: No mass or lymphadenopathy. THORACIC AORTA: No aneurysm. HEART: Normal in size. Coronary artery calcifications are noted. ESOPHAGUS: No wall thickening or dilatation. TRACHEA/BRONCHI: Patent. PLEURA: No effusion or pneumothorax. LUNGS: No nodule, mass, or airspace disease. UPPER ABDOMEN: Partially imaged. No acute pathology. BONES: No aggressive bone lesion or fracture.     No pulmonary embolus or other acute cardiopulmonary process. Incidental  note of coronary artery disease. Electronically signed by Etha Henle    XR CHEST PORTABLE    Result Date: 05/05/2023  EXAM: XR CHEST PORTABLE INDICATION: Chest Pain COMPARISON: Chest x-ray 08/28/2021. TECHNIQUE: Single portable chest AP view FINDINGS: Cardiac monitoring leads are noted. The cardiac silhouette is within normal limits. The pulmonary vasculature is within normal limits. The lungs and pleural spaces are clear. The visualized bones and upper abdomen are age-appropriate.     No acute process on portable chest. Electronically signed by Etha Henle      Discharge Medications:     Current Discharge Medication List        START taking these medications    Details   apixaban  starter pack (ELIQUIS ) 5 MG TBPK tablet Take 1 tablet by mouth See Admin Instructions  Qty: 74 tablet, Refills: 0      atorvastatin  (LIPITOR ) 80 MG tablet Take 1 tablet by mouth nightly  Qty: 30 tablet, Refills: 3      !! oseltamivir  (TAMIFLU ) 75 MG capsule Take 1 capsule by mouth 2 times daily for 8 doses  Qty: 8 capsule, Refills: 0      metoprolol  tartrate (LOPRESSOR ) 50 MG tablet Take 1 tablet by mouth 2 times daily  Qty: 60 tablet, Refills: 3      benzonatate  (TESSALON ) 200 MG capsule Take 1 capsule by mouth 3 times daily as needed for Cough  Qty: 30 capsule, Refills: 0      clopidogrel  (PLAVIX ) 75 MG tablet Take 1 tablet by mouth daily  Qty: 30 tablet, Refills: 3      doxycycline  hyclate (VIBRA -TABS) 100 MG tablet Take 1 tablet by mouth 2 times daily for 10 days  Qty: 20 tablet, Refills: 0      !! oseltamivir  (TAMIFLU ) 75 MG capsule Take 1 capsule by mouth 2 times daily for 5 doses  Qty: 5 capsule, Refills: 0       !! -  Potential duplicate medications found. Please discuss with provider.        STOP taking these medications       albuterol  sulfate HFA (PROVENTIL ;VENTOLIN ;PROAIR ) 108 (90 Base) MCG/ACT inhaler Comments:   Reason for Stopping:         predniSONE  (DELTASONE ) 10 MG tablet Comments:   Reason for Stopping:          permethrin (ELIMITE) 5 % cream Comments:   Reason for Stopping:                Diet: cardiac diet    Wound Care: reinforce dressing PRN and none needed    Follow-up with None, None in 1 week.    Follow-up tests/labs patient needs cardiac cath will follow with cardio   Needs vascular consult for  carotid surgery  Needs pcp all of thiswas arranged prior to d/c    call and make sure you have a follow up, take all discharge papers with you to your next appointment.    35 minutes were spent on the care of this patient today, on day of discharge.  Half the time were for counseling and coordination of care on the floor.    Dear patient, if you are reviewing this note and have a question regarding the medical terminology, please bring it with you to your next PCP visit.  Medical notes are meant to be a communication between medical professionals.  Additionally, portion of this note were created using voice recognition function, syntax and phonetic over may have escaped proofreading.      Sheryl Donna, MD    CC: None, None

## 2023-05-08 NOTE — Procedures (Signed)
 Patient refused the carotid exam

## 2023-05-08 NOTE — Progress Notes (Signed)
 Cardiology Progress Note        Patient: Jacob Turner        Sex: male          DOA: 05/04/2023  Date of Birth:  1960/10/27      Age:  62 y.o.        LOS:  LOS: 3 days      Patient seen and examined, chart reviewed.    Assessment/Plan     Patient Active Problem List   Diagnosis    Atrial fibrillation (HCC)    Intracranial atherosclerosis        Paroxysmal atrial fibrillation CHADsVASc score is 4.0   Shortness of breath on exertion  Hypertension  TIA   Cigarette smoker    Coronary calcification    Carotid artery disease     Telemetry monitor reviewed few brief episodes of paroxysmal atrial fibrillation      Brain MRI reported      Normal MRI of the brain.  Mild maxillary sinus disease.  No intracranial mass, hemorrhage or evidence of acute infarction.     CT chest reported     Coronary artery calcifications are noted. No pulmonary embolus or other acute cardiopulmonary process.  Incidental note of coronary artery disease.     Echocardiogram revealed      Left Ventricle: Normal left ventricular systolic function with a visually estimated EF of 60 - 65%. EF by 2D Simpsons Biplane is 69%. Left ventricle size is normal. Moderately increased wall thickness. Findings consistent with moderate concentric hypertrophy. Normal wall motion. Indeterminate diastolic function due to atrial fibrillation. Average E/e' ratio is 12.07.    Right Ventricle: Low normal systolic function. TAPSE is 1.6 cm. RV Peak S' is 10.1 cm/s.    Left Atrium: Left atrium is mildly dilated. LA Vol Index is  34 ml/m2.    Right Atrium: Right atrium is mildly dilated.    Tricuspid Valve: Mild regurgitation. The estimated PASP is 31 mmHg.    Interatrial Septum: No interatrial shunt visualized with color Doppler. Agitated saline study was negative with and without provocation.     Telemetry monitor revealed atrial fibrillation with controlled ventricular response     Lexiscan  stress test is abnormal and positive for ischemia      Plan:     Continue Plavix  and metoprolol    Continue atorvastatin   Resume Eliquis    Patient does not want to go for any   Invasive workup.   Patient is refusing for cardiac catheterization.  Patient was also refusing Lovenox  and he did not take Lovenox  since order was placed.  No strenuous exercise or strenuous activity for now.    Advised patient to call 911 and come to emergency room if shortness of breath gets worse.    Follow-up in cardiology clinic in 2-3 weeks.                Subjective:    cc:   Complaining of shortness of breath on exertion.      REVIEW OF SYSTEMS:     General: No fevers or chills.  Cardiovascular: positive shortness of breath on exertion, No chest pain,No palpitations, No orthopnea, No PND, No leg swelling, No claudication  Pulmonary: No  Dyspnea.   Gastrointestinal: No nausea, vomiting, bleeding  Neurology: No Dizziness    Objective:      Visit Vitals  BP 119/72   Pulse 73   Temp 97.3 F (36.3 C) (Oral)   Resp 18   Ht 1.778  m (5\' 10" )   Wt 92.5 kg (204 lb)   SpO2 100%   BMI 29.27 kg/m     Body mass index is 29.27 kg/m.    Physical Exam:  General Appearance: Comfortable, not using accessory muscles of respiration.  HEENT: PERLA.   HEAD: Atraumatic  NECK: No JVD, no thyroidomeglay. CAROTIDS: no bruit  LUNGS: Clear bilaterally.   HEART: S1+S2 audible, irregularly irregular, no murmur, no pericardial rub.     ABD: Non-tender, BS Audible    EXT: No edema, and no cyanosis.  VASCULAR EXAM: Pulses are intact.    PSYCHIATRIC EXAM: Mood is appropriate.  MUSCULOSKELETAL: Grossly no joint deformity.  NEUROLOGICAL: AAO times 3, No motor and sensory deficit    Medication:  Current Facility-Administered Medications   Medication Dose Route Frequency    metoprolol  tartrate (LOPRESSOR ) tablet 50 mg  50 mg Oral BID    atorvastatin  (LIPITOR ) tablet 80 mg  80 mg Oral Nightly    clopidogrel  (PLAVIX ) tablet 75 mg  75 mg Oral Daily    doxycycline  (VIBRAMYCIN ) 100 mg in sodium chloride  0.9 % 100 mL IVPB  (mini-bag)  100 mg IntraVENous Q12H    benzonatate  (TESSALON ) capsule 100 mg  100 mg Oral TID    guaiFENesin  (ROBITUSSIN) 100 MG/5ML liquid 200 mg  200 mg Oral Q4H PRN    oseltamivir  (TAMIFLU ) capsule 75 mg  75 mg Oral BID    enoxaparin  (LOVENOX ) injection 90 mg  1 mg/kg SubCUTAneous BID    calcium  carbonate (TUMS) chewable tablet 500 mg  500 mg Oral TID PRN    ondansetron  (ZOFRAN ) injection 4 mg  4 mg IntraVENous Q4H PRN    acetaminophen  (TYLENOL ) tablet 650 mg  650 mg Oral Q4H PRN    sodium chloride  flush 0.9 % injection 5-40 mL  5-40 mL IntraVENous 2 times per day    sodium chloride  flush 0.9 % injection 5-40 mL  5-40 mL IntraVENous PRN    potassium chloride  (KLOR-CON  M) extended release tablet 40 mEq  40 mEq Oral PRN    Or    potassium bicarb-citric acid  (EFFER-K) effervescent tablet 40 mEq  40 mEq Oral PRN    Or    potassium chloride  10 mEq/100 mL IVPB (Peripheral Line)  10 mEq IntraVENous PRN    magnesium  sulfate 2000 mg in 50 mL IVPB premix  2,000 mg IntraVENous PRN    polyethylene glycol (GLYCOLAX ) packet 17 g  17 g Oral Daily PRN    [Held by provider] apixaban  (ELIQUIS ) tablet 5 mg  5 mg Oral BID    benzonatate  (TESSALON ) capsule 200 mg  200 mg Oral TID PRN    metoprolol  (LOPRESSOR ) injection 5 mg  5 mg IntraVENous Q5 Min PRN               Lab/Data Reviewed:       Recent Labs     05/06/23  0531 05/08/23  0506   WBC 7.6 6.8   HGB 15.0 16.7*   HCT 44.7 49.9*   PLT 187 192     Recent Labs     05/06/23  0531 05/08/23  0506   NA 137 140   K 3.9 4.0   CL 105 112*   CO2 26 22   BUN 13 14       Signed By: Thomes Flicker, MD     May 08, 2023

## 2023-05-08 NOTE — Progress Notes (Addendum)
 50 Spoke with Dr. Dairl Dry regarding patients refusal of enoxaparin  injection. This RN educated patient on importance of enoxaparin  administration due to recent TIA and scheduled cardiac procedure. Patient still declined enoxaparin  administration stating "no I don't want that or the procedure I want to go home".     0247 Pt alerted this RN to leaking IV, states that IV is "stinging and burning" with antibiotic infusion. IV leaking noted, flushed IV, patient reported discomfort and asked to remove IV. Pt declined new IV placement and inquired as to what time he could be discharged tomorrow. Educated patient on hospital discharge procedures and explained that there is not set time for discharges. Patient verbalized understanding. No further questions or concerns voiced at this time.

## 2023-05-08 NOTE — Plan of Care (Signed)
 Problem: Discharge Planning  Goal: Discharge to home or other facility with appropriate resources  Outcome: Progressing     Problem: Safety - Adult  Goal: Free from fall injury  Outcome: Progressing     Problem: Pain  Goal: Verbalizes/displays adequate comfort level or baseline comfort level  Outcome: Progressing  Flowsheets  Taken 05/08/2023 1200  Verbalizes/displays adequate comfort level or baseline comfort level:   Encourage patient to monitor pain and request assistance   Assess pain using appropriate pain scale  Taken 05/08/2023 0800  Verbalizes/displays adequate comfort level or baseline comfort level: Encourage patient to monitor pain and request assistance

## 2023-05-08 NOTE — Care Coordination-Inpatient (Addendum)
 1:58 PM: This CM reached out to [employer] to request screening for Medicaid.    2:43 PM: This CM able to obtain meds for patient using [facility] [employer]. Medications taken to patient in room. Per pharmacy, they only had limited supply of Tamiflu  and patient can come in for the rest of the prescription on Tuesday. Patient educated about that and patient verbalized understanding.

## 2023-07-31 ENCOUNTER — Ambulatory Visit: Admit: 2023-07-31 | Discharge: 2023-07-31 | Attending: Family | Primary: Family

## 2023-07-31 ENCOUNTER — Inpatient Hospital Stay: Admit: 2023-07-31 | Primary: Family

## 2023-07-31 VITALS — BP 96/64 | HR 71 | Temp 97.40000°F | Resp 22 | Ht 70.0 in | Wt 206.0 lb

## 2023-07-31 DIAGNOSIS — I4891 Unspecified atrial fibrillation: Secondary | ICD-10-CM

## 2023-07-31 LAB — CBC WITH AUTO DIFFERENTIAL
Basophils %: 0.7 % (ref 0.0–2.0)
Basophils Absolute: 0.07 10*3/uL (ref 0.00–0.10)
Eosinophils %: 2.6 % (ref 0.0–5.0)
Eosinophils Absolute: 0.28 10*3/uL (ref 0.00–0.40)
Hematocrit: 51.2 % — ABNORMAL HIGH (ref 36.0–48.0)
Hemoglobin: 17 g/dL — ABNORMAL HIGH (ref 13.0–16.0)
Immature Granulocytes %: 0.4 % (ref 0.0–0.5)
Immature Granulocytes Absolute: 0.04 10*3/uL (ref 0.00–0.04)
Lymphocytes %: 26.3 % (ref 21.0–52.0)
Lymphocytes Absolute: 2.78 10*3/uL (ref 0.90–3.60)
MCH: 29.8 pg (ref 24.0–34.0)
MCHC: 33.2 g/dL (ref 31.0–37.0)
MCV: 89.8 FL (ref 78.0–100.0)
MPV: 11 FL (ref 9.2–11.8)
Monocytes %: 10.1 % — ABNORMAL HIGH (ref 3.0–10.0)
Monocytes Absolute: 1.07 10*3/uL (ref 0.05–1.20)
Neutrophils %: 59.9 % (ref 40.0–73.0)
Neutrophils Absolute: 6.35 10*3/uL (ref 1.80–8.00)
Nucleated RBCs: 0 /100{WBCs}
Platelets: 245 10*3/uL (ref 135–420)
RBC: 5.7 M/uL — ABNORMAL HIGH (ref 4.35–5.65)
RDW: 13.2 % (ref 11.6–14.5)
WBC: 10.6 10*3/uL (ref 4.6–13.2)
nRBC: 0 10*3/uL (ref 0.00–0.01)

## 2023-07-31 LAB — LIPID PANEL
Chol/HDL Ratio: 4.6 (ref 0–5.0)
Cholesterol, Total: 208 mg/dL — ABNORMAL HIGH (ref <200)
HDL: 45 mg/dL (ref 40–60)
LDL Cholesterol: 132.8 mg/dL — ABNORMAL HIGH (ref 0–100)
Triglycerides: 151 mg/dL — ABNORMAL HIGH (ref <150)
VLDL Cholesterol Calculated: 30.2 mg/dL

## 2023-07-31 LAB — URINALYSIS WITH MICROSCOPIC
Bilirubin, Urine: NEGATIVE
Blood, Urine: NEGATIVE
Glucose, Ur: NEGATIVE mg/dL
Leukocyte Esterase, Urine: NEGATIVE
Nitrite, Urine: NEGATIVE
Specific Gravity, UA: 1.025 (ref 1.005–1.030)
Urobilinogen, Urine: 0.2 EU/dL (ref 0.2–1.0)
pH, Urine: 5.5 (ref 5.0–8.0)

## 2023-07-31 LAB — COMPREHENSIVE METABOLIC PANEL
ALT: 53 U/L (ref 16–61)
AST: 30 U/L (ref 10–38)
Albumin/Globulin Ratio: 0.9 (ref 0.8–1.7)
Albumin: 3.7 g/dL (ref 3.4–5.0)
Alk Phosphatase: 142 U/L — ABNORMAL HIGH (ref 45–117)
Anion Gap: 7 mmol/L (ref 3.0–18)
BUN/Creatinine Ratio: 19 (ref 12–20)
BUN: 25 mg/dL — ABNORMAL HIGH (ref 7.0–18)
CO2: 24 mmol/L (ref 21–32)
Calcium: 9.4 mg/dL (ref 8.5–10.1)
Chloride: 106 mmol/L (ref 100–111)
Creatinine: 1.31 mg/dL — ABNORMAL HIGH (ref 0.6–1.3)
Est, Glom Filt Rate: 62 mL/min/{1.73_m2} (ref >60)
Globulin: 3.9 g/dL (ref 2.0–4.0)
Glucose: 91 mg/dL (ref 74–99)
Potassium: 4 mmol/L (ref 3.5–5.5)
Sodium: 137 mmol/L (ref 136–145)
Total Bilirubin: 1.3 mg/dL — ABNORMAL HIGH (ref 0.2–1.0)
Total Protein: 7.6 g/dL (ref 6.4–8.2)

## 2023-07-31 LAB — HEMOGLOBIN A1C
Estimated Avg Glucose: 114 mg/dL
Hemoglobin A1C: 5.6 % (ref 4.2–5.6)

## 2023-07-31 LAB — TSH: TSH, 3rd Generation: 1.59 u[IU]/mL (ref 0.36–3.74)

## 2023-07-31 LAB — PSA SCREENING: PSA: 0.6 ng/mL (ref 0.0–4.0)

## 2023-07-31 MED ORDER — APIXABAN 5 MG PO TABS
5 MG | ORAL_TABLET | Freq: Two times a day (BID) | ORAL | 5 refills | Status: DC
Start: 2023-07-31 — End: 2023-08-08

## 2023-07-31 MED ORDER — METOPROLOL TARTRATE 50 MG PO TABS
50 | ORAL_TABLET | Freq: Two times a day (BID) | ORAL | 3 refills | Status: AC
Start: 2023-07-31 — End: ?

## 2023-07-31 MED ORDER — ATORVASTATIN CALCIUM 80 MG PO TABS
80 | ORAL_TABLET | Freq: Every evening | ORAL | 3 refills | Status: AC
Start: 2023-07-31 — End: ?

## 2023-07-31 MED ORDER — CLOPIDOGREL BISULFATE 75 MG PO TABS
75 | ORAL_TABLET | Freq: Every day | ORAL | 3 refills | Status: AC
Start: 2023-07-31 — End: ?

## 2023-07-31 NOTE — Progress Notes (Signed)
 Patient presents for lab draw ordered by:    Ordering Provider:  Donnella Sham NP  Ordering Department/Practice:  [facility]  Phone:  [phone]  Date Ordered:  07/31/2023    The following labs were drawn and sent to [facility] by Whitney Post, LPN:    CBC, Lipid Profile, CMP, TSH, 3rd Generation, HgA1C, and PSA      The following tubes were sent:    TUBE COLORS FOR BLOOD SPECIMEN: Gold  ( 4) and Lavender  ( 2)      Draw site right brachial.  Patient tolerated draw with no distress.       Patient given Medicaid application and financial assistance application for [facility]    Patient advised will submit referral for cardiology to [facility] they will  call him to schedule an appointment      Discharge instructions reviewed with patient    Medication list and understanding of medications reviewed with patient.   OTC and herbal medications reviewed and added to med list if applicable  Barriers to adherence assessed.    Guidance given regarding new medications this visit, including reason for taking this medicine, and common side effects.     AVS given to patient. Explained to patient. Patient expressed understanding.

## 2023-07-31 NOTE — Progress Notes (Signed)
 I have reviewed the attatched progress note and I agree with the plan and medical decision making as outlined by Donnella Sham, FNP    Donnie Mesa MD

## 2023-07-31 NOTE — Progress Notes (Signed)
 HPI  Jacob Turner is a 63 y.o. male being seen today for   Chief Complaint   Patient presents with    Follow-Up from [facility]     05/04/23, [facility]. Atrial Fibrillation     Medication Refill     Patient stated he ran out his medication 2 weeks ago      Patient is here for initial visit to [facility].  Hospitalized at [facility] in December for about a week in Dec 2024. Initially went in for the flu but was also diagnosed at that time with A.Fib and a Right carotid artery occlusion. Was supposed to have follow up post discharge but he did not. Has been out of medications x 2 weeks.     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    Currently reporting intermittent dizziness and SOB. Denies CP. Continues to have irregular heart rate, palpitations. Also reporting some episodes of urinary frequency, denies any pain and burning, hematuria, or difficulty urinating.    Smokes about 5 cigarettes per day. He denies alcohol or other drug use.    He is currently uninsured. He does not work. Has not applied for Medicaid. Does have appt with [employer] on 08/06/23 to apply for disability. He reports very limited financial resources and no support people. Previously gave plasma for money, but can no longer do this due to medical conditions.      Past Medical History:   Diagnosis Date    Scabies          ROS  Patient states that he is feeling well except as noted in HPI. Denies complaints of chest pain, swelling of legs, weakness. he denies nausea, vomiting or diarrhea.        Current Outpatient Medications   Medication Sig    metoprolol tartrate (LOPRESSOR) 50 MG tablet Take 1 tablet by mouth 2 times daily    clopidogrel (PLAVIX) 75 MG tablet Take 1 tablet by mouth daily    atorvastatin (LIPITOR) 80 MG tablet Take 1 tablet by mouth nightly    apixaban (ELIQUIS) 5 MG TABS tablet Take 1 tablet by mouth 2 times daily     No current facility-administered medications for this visit.       PE  BP  96/64 (BP Site: Left Upper Arm, Patient Position: Sitting, BP Cuff Size: Large Adult)   Pulse 71   Temp 97.4 F (36.3 C) (Temporal)   Resp 22   Ht 1.778 m (5\' 10" )   Wt 93.4 kg (206 lb)   SpO2 98%   BMI 29.56 kg/m     Alert and oriented with normal mood and affect. he is well developed and well nourished . Lungs are clear without wheezing. Heart rate is irregular. Abd is soft non tender with good bs. There is no lower extremity edema.     No results found for this visit on 07/31/23.      Assessment and Plan:       Diagnosis Orders   1. Atrial fibrillation, unspecified type (HCC)  Hemoglobin A1C    Lipid Panel    Comprehensive Metabolic Panel    CBC with Auto Differential    TSH    External Referral To Cardiology      2. Right carotid artery occlusion  External Referral To Vascular Surgery      3. History of CVA (cerebrovascular accident)        4. Urine frequency  PSA Screening  Urinalysis with Microscopic      5. Smoker          Patient is high risk for another CV event. I have emphasized the importance of med compliance and smoking cessation as well as follow up with Cardiology and Vascular for ongoing management. Refills sent for meds except Eliquis after advising patient of cost. Will try to obtain through [facility].    Labs today.    Medicaid application given.    1 mos follow up    Bartholome Bill, APRN - NP

## 2023-08-01 NOTE — Telephone Encounter (Signed)
Pharmacy assistance application mailed to patient

## 2023-08-02 NOTE — Telephone Encounter (Signed)
 Patient advised  Urine analysis  showed some bacteria and I Nurse practitioner would like to repeat this along with a culture. In the meantime  needs to make sure are drinking plenty of water. Patient scheduled for April 1,2025

## 2023-08-02 NOTE — Telephone Encounter (Signed)
-----   Message from Donnella Sham, APRN - NP sent at 08/01/2023  9:56 AM EDT -----  Please call patient and schedule him for a follow up appointment for as soon as he is able to come in. Urine specimen showed some bacteria and I would like to repeat this along with a culture. In the meantime he needs to make sure he is drinking plen  ty of water.

## 2023-08-02 NOTE — Telephone Encounter (Signed)
 Spoke with patient advised will faxed referral and records to [facility] cardiology and vascular they will call him to schedule appointments

## 2023-08-03 NOTE — Telephone Encounter (Signed)
 Referral and records faxed to [facility] Vascular at [phone] they will call patient to schedule appointment

## 2023-08-03 NOTE — Telephone Encounter (Signed)
 Referral and records faxed to [facility] Cardiology at [phone] they will call patient to schedule appointment

## 2023-08-07 ENCOUNTER — Inpatient Hospital Stay: Admit: 2023-08-07 | Primary: Family

## 2023-08-07 ENCOUNTER — Ambulatory Visit: Admit: 2023-08-07 | Discharge: 2023-08-07 | Attending: Family | Primary: Family

## 2023-08-07 VITALS — BP 125/81 | HR 107 | Temp 97.40000°F | Resp 20 | Ht 70.0 in | Wt 208.0 lb

## 2023-08-07 DIAGNOSIS — R8290 Unspecified abnormal findings in urine: Secondary | ICD-10-CM

## 2023-08-07 LAB — URINALYSIS WITH MICROSCOPIC
BACTERIA, URINE: UNDETERMINED /HPF — AB
Bilirubin, Urine: NEGATIVE
Blood, Urine: NEGATIVE
Glucose, Ur: NEGATIVE mg/dL
Leukocyte Esterase, Urine: NEGATIVE
Nitrite, Urine: NEGATIVE
Protein, UA: 30 mg/dL — AB
Specific Gravity, UA: 1.022 (ref 1.005–1.030)
Urobilinogen, Urine: 0.2 EU/dL (ref 0.2–1.0)
pH, Urine: 5 (ref 5.0–8.0)

## 2023-08-07 LAB — AMB POC URINALYSIS DIP STICK AUTO W/O MICRO
Glucose, Urine, POC: NEGATIVE
Ketones, Urine, POC: NEGATIVE
Leukocyte Esterase, Urine, POC: NEGATIVE
Nitrite, Urine, POC: NEGATIVE
Specific Gravity, Urine, POC: 1.03 (ref 1.001–1.035)
Urobilinogen, POC: 0.2
pH, Urine, POC: 5 (ref 4.6–8.0)

## 2023-08-07 NOTE — Progress Notes (Signed)
 Discharge instructions reviewed with patient    Medication list and understanding of medications reviewed with patient.   OTC and herbal medications reviewed and added to med list if applicable  Barriers to adherence assessed.    Guidance given regarding new medications this visit, including reason for taking this medicine, and common side effects.     AVS given to patient. Explained to patient. Patient expressed understanding.

## 2023-08-07 NOTE — Progress Notes (Signed)
 HPI  Jacob Turner is a 63 y.o. male being seen today for   Chief Complaint   Patient presents with    Follow-up     Urine f/u. Patient stated he has been drinking a lot of water     OTHER     Patient stated when he walk long distance he gets dizzy and has shortness of breath       Patient is here for follow up visit.  Urinalysis at last visit showed bacteria present with microscopic exam. No culture was done at that time. He denies any frequency, pain or burning with urination, blood in urine, low back pain, fever or other symptoms. Has been trying to drink more water since last visit. No history of UTI or kidney stones.    Continues to have palpitations, SOB, dizziness with too much activity. Hx of afib, carotid blockage. No worsening of symptoms or new symptoms reported. He was able to get prescriptions filled and is compliant with taking them. Exception is Eliquis which we are trying to obtain through PAP.    Applied for [employer] Disability yesterday. Working on [employer] application. Has received [employer] financial assistance forms for Cardiology and Vascular Surg and he is planning to get them turned in today.      Past Medical History:   Diagnosis Date    Scabies          ROS  Patient states that he is feeling well except as noted in HPI. Denies complaints of chest pain,swelling of legs, or weakness. he denies nausea, vomiting or diarrhea.        Current Outpatient Medications   Medication Sig    metoprolol tartrate (LOPRESSOR) 50 MG tablet Take 1 tablet by mouth 2 times daily    clopidogrel (PLAVIX) 75 MG tablet Take 1 tablet by mouth daily    atorvastatin (LIPITOR) 80 MG tablet Take 1 tablet by mouth nightly    apixaban (ELIQUIS) 5 MG TABS tablet Take 1 tablet by mouth 2 times daily     No current facility-administered medications for this visit.       PE  BP 125/81 (BP Site: Right Upper Arm, Patient Position: Sitting, BP Cuff Size: Medium Adult)   Pulse (!) 107   Temp 97.4 F (36.3 C) (Temporal)   Resp 20    Ht 1.778 m (5\' 10" )   Wt 94.3 kg (208 lb)   SpO2 98%   BMI 29.84 kg/m      Alert and oriented with normal mood and affect. he is well developed and well nourished . Lungs are clear without wheezing. Heart rate is irregular. Abd is soft, non tender with good bs. There is no CVAT. There is no lower extremity edema.     Results for orders placed or performed in visit on 08/07/23   AMB POC URINALYSIS DIP STICK AUTO W/O MICRO   Result Value Ref Range    Color, Urine, POC Yellow     Clarity, Urine, POC Clear     Glucose, Urine, POC Negative     Bilirubin, Urine, POC 1+     Ketones, Urine, POC Negative     Specific Gravity, Urine, POC 1.030 1.001 - 1.035    Blood, Urine, POC Trace Negative    pH, Urine, POC 5.0 4.6 - 8.0    Protein, Urine, POC 1+     Urobilinogen, POC 0.2 mg/dL     Nitrite, Urine, POC Negative     Leukocyte Esterase, Urine, POC  Negative      BACTERIA, URINE   Date/Time Value Ref Range Status   07/31/2023 11:50 AM FEW (A) NEG /hpf Final         Assessment and Plan:       Diagnosis Orders   1. Abnormal finding on urinalysis  Urinalysis with Microscopic    Culture, Urine    AMB POC URINALYSIS DIP STICK AUTO W/O MICRO        Urine dip neg for leuk/nitrite. Previous specimen positive for bacteria. Repeat full urinalysis and sent urine for culture. Patient is asymptomatic. Low suspicion for UTI. Encouraged to drink plenty of water. Continue to monitor for any symptoms. He states understanding.    Follow up 3 weeks as previously scheduled.    Bartholome Bill, APRN - NP

## 2023-08-07 NOTE — Progress Notes (Signed)
 I have reviewed the attatched progress note and I agree with the plan and medical decision making as outlined by Donnella Sham, FNP    Donnie Mesa MD

## 2023-08-08 LAB — CULTURE, URINE: Culture: NO GROWTH

## 2023-08-08 MED ORDER — APIXABAN 5 MG PO TABS
5 | ORAL_TABLET | Freq: Two times a day (BID) | ORAL | 3 refills | Status: AC
Start: 2023-08-08 — End: ?

## 2023-08-08 NOTE — Telephone Encounter (Signed)
 Med list and chart notes reviewed.  Pharmacy Assistance Medication approved for pickup.    Eliqis 5mg  po bid

## 2023-08-17 NOTE — Telephone Encounter (Signed)
 Patient inquiring about his urine results from  April 1  states been trying to drink more water but since his last visit notice urine is darker and notice a odor to urine twice

## 2023-08-17 NOTE — Telephone Encounter (Signed)
 Patient advised His urine culture did not show any infection, however if he is noticing new symptoms we should get him back in for a follow up. If he is having any acute abdominal or back pain, fever, pain with urinating, or blood in urine then he should go to the emergency room patient states will keep his appointment on 08/28/23

## 2023-08-28 ENCOUNTER — Ambulatory Visit: Admit: 2023-08-28 | Discharge: 2023-08-28 | Attending: Family | Primary: Family

## 2023-08-28 VITALS — BP 160/80 | HR 85 | Temp 96.80000°F | Resp 18 | Ht 70.0 in | Wt 208.0 lb

## 2023-08-28 DIAGNOSIS — I4891 Unspecified atrial fibrillation: Secondary | ICD-10-CM

## 2023-08-28 NOTE — Progress Notes (Signed)
 HPI  Jacob Turner is a 63 y.o. male being seen today for   Chief Complaint   Patient presents with    Numbness    Follow-up     Patient is here for follow up visit.    He is feeling "pretty well" this morning. He continues to have heart fluttering, SOB with exertion. Also reporting two episodes of left leg feeling numb. Lasted several minutes and then resolved. Denies any leg swelling. Denies CP. He continues to smoke, but has decreased to approx 5 cig per day. His appetite is good. He is drinking plenty of water. Compliant with meds.    Waiting to hear from [facility] Cardiology and Vasc Surgery. No appointments scheduled yet.    Working with disability case worker through [employer]. Everything has been submitted and he is awaiting decision.      Past Medical History:   Diagnosis Date    Scabies          ROS  Patient states that he is feeling well. Denies complaints of chest pain, swelling of legs, dizziness or weakness. he denies nausea, vomiting or diarrhea.        Current Outpatient Medications   Medication Sig    apixaban  (ELIQUIS ) 5 MG TABS tablet Take 1 tablet by mouth 2 times daily    metoprolol  tartrate (LOPRESSOR ) 50 MG tablet Take 1 tablet by mouth 2 times daily    clopidogrel  (PLAVIX ) 75 MG tablet Take 1 tablet by mouth daily    atorvastatin  (LIPITOR ) 80 MG tablet Take 1 tablet by mouth nightly (Patient not taking: Reported on 08/28/2023)     No current facility-administered medications for this visit.       PE  BP (!) 160/80 (BP Site: Left Upper Arm, Patient Position: Sitting, BP Cuff Size: Large Adult)   Pulse 85   Temp 96.8 F (36 C) (Temporal)   Resp 18   Ht 1.778 m (5\' 10" )   Wt 94.3 kg (208 lb)   SpO2 97%   BMI 29.84 kg/m     Alert and oriented with normal mood and affect. he is well developed and well nourished . Lungs are clear without wheezing. Heart rate is slightly irregular without murmurs or gallops. There is no lower extremity edema.   Repeat BP 138/68    No results found for  this visit on 08/28/23.      Assessment and Plan:       Diagnosis Orders   1. Atrial fibrillation, unspecified type (HCC)        2. Right carotid artery occlusion        3. History of CVA (cerebrovascular accident)        4. Smoker          Conditions appear stable, however he is aware of the urgency for treatment and risk for another CV event.  Awaiting Cardiology and Vascular Surg appt through [facility].  PAP pending for eliquis .  Encouraged complete smoking cessation. Stay well hydrated. He has been instructed to go to ED for any acute changes to include CP, worsening SOB, confusion, edema. He states understanding and is in agreement with this plan.  I will continue to follow him closely.    1 mos follow up   Encarnacion Harris, APRN - NP

## 2023-08-28 NOTE — Progress Notes (Signed)
 Discharge instructions reviewed with patient    Medication list and understanding of medications reviewed with patient.   OTC and herbal medications reviewed and added to med list if applicable  Barriers to adherence assessed.    Guidance given regarding new medications this visit, including reason for taking this medicine, and common side effects.     AVS given to patient. Explained to patient. Patient expressed understanding.

## 2023-09-25 ENCOUNTER — Encounter: Attending: Family | Primary: Family

## 2023-09-28 NOTE — Telephone Encounter (Signed)
 Reached out to patient to inform him we will have to cancel his his appointment on 10/02/23 due to him having insurance. Patient was not aware his insurance was active and has not received a card. Advised patient to reach out to his insurance as soon as he receive a card to schedule an visit with a new PCP. Also informed patient to reach out if he needs any refills on his medications until he can't get in with a new PCP. Patient expressed understanding. Medicaid is active per [employer].

## 2023-10-02 ENCOUNTER — Encounter: Attending: Family | Primary: Family

## 2023-10-26 NOTE — Discharge Summary (Signed)
 -------------------------------------------------------------------------------  Attestation signed by Casimir Glendia LABOR, MD at 10/26/23 1145  I personally obtained history, examined the patient and directed the development of the management plan.  I agree with the findings as documented except as I have noted.    I have independently performed the substantive portion of the visit including the medical decision making and formulation of the assessment and plan.    My independent encounter is notable for     Jacob Turner is a 63 y.o. male with carotid disease seen by cardiology for AFIB and chest discomforts, referred for cath after abnroaml Nuclear stress test 05/07/23 (mild basal inferior reversible defect, TID 1.3, EF 55%)  Cath 10/23/2023 with coronary artery disease best treated with initial optimal medical therapy per Dr. Leodis  Films reviewed and no critical proximal disease, branch/distal vessel and otherwise mild-moderate coronary artery disease.  Flet AFIB /RVR driving symptoms so had admission and TEE/DCCV done as inpt  Feels good, walking without chest pain or shortness of breath  Anxious to go home  Plan is 3-4 weeks uninterrupted anticoagulation with amio therapy (load/taper to 200 mg daily)  Then can proceed with 2 day eliquis  hold for carotid procedures  Resume eliquis  post carotid procedures and plans for outpt ablation after recovery from this with hope of getting OFF amio once past the blanking period  Continue Plavix  for PAD  Counseled on risks of amio treatment and emphasized risk of sun exposure, liver/lung/thyroid/eyes will need monitoring).    Further plans and meds/follow up details as below.    Thank you for allowing me to participate in his care.       Glendia LABOR Casimir, MD  11:38 AM, 10/26/2023    -------------------------------------------------------------------------------    [facility]    Discharge Summary      Patient Jacob Turner, 63 y.o. male   MRN [id number]    [facility]       Outpatient Cardiologist: Dr MYRTIS Leodis    Date of Admission: 10/23/2023  Date of Discharge: 10/26/2023      Discharge Diagnosis:    Persistent Atrial fibrillation s/p TEE DCCV 10/25/2023  Planned amiodarone load as short course bridge to ablation, uninterrupted OAC x 30 days.  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 male Mhx as above who presented to the hospital on 10/23/2023 for planned LHC to assess coronary arteries for risk stratification for planned carotid procedure. A left heart catheterization was performed by Dr. Leodis which demonstrated Mid LAD 50%, D1 30%, prox-mid RCA 50%, RPAV 70% appropriate for medical therapy. Pt in afib of unknown duration with intermittent RVR and EP was consulted who did TEE/DCCV 10/25/2023.  Pt tolerated the procedures well with no complications during observations. Pt will continue on metoprolol , load on amiodarone, start eliquis , uninterrupted for 30 days then ok for vascular procedure, will plan for ablation with EP after vascular intervention, EP to see in office 2 mo.  Will continue on metoprolol , plavix .  Pt is stable for discharge  and will follow up in the office.    After 30 days ok for vascular procedure, then plan for ablation with EP after vascular intervention, see in office 2 mo.    Is the patient a current smoker? Yes  Cessation education completed prior to discharge: Yes    Condition at discharge:Afebrile, Ambulating, Eating, Drinking, Voiding, and Stable    Disposition: Home    Procedures:  Conclusion:        Impression:  Moderate disease in the posterior descending artery  Intermediate disease in the mid right coronary artery and mid left anterior descending artery  Normal left ventricular end-diastolic pressure  Uncontrolled atrial fibrillation           Recommendation:  Will adjust medications for rate control  Will intensify antianginal therapy  Okay to proceed with procedure preoperative assessment in epic given     [facility]                                        Procedure:  Direct Current Cardioversion      Indication: Persistent atrial fibrillation     Informed consent was obtained after full discussion of all risks/benefits/complications and alternatives.  Please see full chart for further details.     Patient received anesthesia as per anesthesia records.     Patient received 1 synchronized biphasic shock(s) at 360 J with manual pressure.  Final Rhythm was sinus.     Jacob Turner, M.D., M.Sc.    Diagnostic Studies:    Admitted:10/23/2023     10/25/2023     Procedure: TRANSESOPHAGEAL ECHO, spectral Doppler analysis, color flow Doppler     Indication: Atrial fibrillation, pre-cardioversion     Physician: Jacob LITTIE Mason, MD  Assistant: None     Description:  On 10/25/2023 Jacob Turner was brought to the holding area in a fasting state. Patients identity was verified using 2 identifiers.   Risks, benefits and alternatives of the procedure were discussed. Risk of the procedure as well as risks associated with conscious sedation were explained in detail to the patient. Patient verbalizes understanding the risks of major complications including death, arrhythmias, and angina/heart attack associated with the procedure. Mechanical complications including traumatic injury to the oropharynx as well as esophageal injury, perforation and mediastinitis were discussed. Rare complication of methemoglobinemia also discussed.     The oropharynx was topically anesthesized using viscous lidocaine swish and  swallow followed benzocaine spray.   Patient received MAC anesthesia  Insertion of the probe was uncomplicated.    Patient tolerated the procedure well, there were no complications.     FINDINGS  Left ventricular function is normal with an ejection fraction estimated at 60 %  Left atrium size is normal  Right atrium size is normal  Normal right ventricular size and function  There is no PFO/ASD by color flow Doppler.  Normal insertion of the pulmonary veins into the left atrium.  There are no clots in the left atrial appendage  Normal size of the pulmonary artery  The aortic valve is trileaflet with calcification/thickening and evidence of moderate central aortic insufficiency  The mitral valve is morphologically normal though which posterior leaflet restriction. There is moderate mitral regurgitation  The tricuspid valve is morphologically normal. There is moderate tricuspid regurgitation  The pulmonic valve is not well-visualized  There is no pericardial effusion  The aorta has mild  atheromatous disease.     IMPRESSION  Normal left ventricular systolic function with an ejection fraction of 60%  There is moderate MR/AI/TR  No LAA thrombus     Jacob Turner, M.D., M.Sc.      Physical Exam:   BP 146/67   Pulse 63   Temp 97.4 F (36.3 C)   Resp 16   Ht 5' 10 (1.778 m)   Wt 94.4 kg (208 lb 1.8 oz)   SpO2 98%   BMI 29.86 kg/m     CONSTITUTIONAL:   well developed, nourished, no distress, alert and oriented x 3, and VS reviewed   NECK: ROM normal, supple, no thyromegaly, and no carotid bruit   CARDIOVASCULAR:  regular rate and rhythm, no murmur, rub or gallop, and 2+ bilateral femoral and dorsalis pedis pulses  Edema: no    PULMONARY/CHEST WALL: effort normal and clear to auscultation bilaterally   ABDOMINAL: soft, nontender, no hepatosplenomegaly, non-palpable abdominal aorta, and no aortic bruit   EXTREMITIES: normal ROM, no clubbing of digits, and no hematoma at cath site     Telemetry: PT self dc'd tele,  was in SR at 930 a, brief afib overnight    Lab Review:     CBC w/Diff  Lab Results   Component Value Date/Time    WBC 8.3 10/24/2023 05:09 AM    RBC 5.13 10/24/2023 05:09 AM    HEMOGLOBIN 15.3 10/24/2023 05:09 AM    HCT 44.8 10/24/2023 05:09 AM    MCV 87 10/24/2023 05:09 AM    MCH 30 10/24/2023 05:09 AM    MCHC 34 10/24/2023 05:09 AM    RDW 13.1 10/24/2023 05:09 AM    PLATELET 185 10/24/2023 05:09 AM    MPV 10.7 10/24/2023 05:09 AM        Basic Metabolic Profile  Lab Results   Component Value Date    NA 137 10/24/2023    POTASSIUM 4.2 10/24/2023    CHLORIDE 107 10/24/2023    CO2 19 (L) 10/24/2023    BUN 12 10/24/2023    CREAT 0.9 10/24/2023    GLUCOSE 98 10/24/2023    CALCIUM  9.1 10/24/2023           DISCHARGE INSTRUCTIONS:    Discharge Medications:      Discharge Medication List        START taking these medications      * amiodarone 200 mg Tabs  Start date: October 26, 2023  Take 2 Tabs by Mouth Twice Daily for 7 days, THEN 2 Tabs Once a Day for 7 days, THEN 1 Tab Once a Day for 14 days.  Dispense: 56 Tab  Refills: 0  Commonly known as: Cordarone      * amiodarone 200 mg Tabs  Start date: November 23, 2023  Take 1 Tab by Mouth Once a Day.  Dispense: 30 Tab  Refills: 0  Commonly known as: Cordarone           * * This list has 2 medication(s) that are the same as other medications prescribed for you. Read the directions carefully, and ask your doctor or other care provider to review them with you.                CONTINUE taking these medications      apixaban  5 mg Tabs  Take 1 Tab by Mouth Twice Daily.  Refills: 0  Commonly known as: Eliquis       atorvastatin  80 mg Tabs  Take 1  Tab by Mouth every evening.  Refills: 0  Commonly known as: Lipitor       clopidogreL  75 mg Tabs  Take 1 Tab by Mouth Once a Day.  Refills: 0  Commonly known as: Plavix       metoprolol  25 mg Tabs  Take 1 Tab by Mouth Twice Daily.  Dispense: 120 Tab  Refills: 3  Commonly known as: Lopressor       ranolazine 500 mg Tb12  Take 1 Tab by Mouth Twice  Daily.  Dispense: 60 Tab  Refills: 3  Commonly known as: Ranexa               Discharge Follow Up:   Discharge Procedure Orders   Discharge: Activity   Order Comments: No driving, operating heavy machinery, making any legal decisions or drinking alcohol for 24 hours after procedure due to anesthesia  You must have someone stay with you overnight the first night after your procedure due to anesthesia     Discharge ... Follow Up with Surgeon   Order Comments: Routine follow up with your vascular surgeon, Dr. Korene in 2 weeks to discuss your upcoming carotid surgery The office will contact you with your follow up appointment date and time. Please call [phone] if you have any questions or concerns or if you do not hear from them.     Additional Instructions as Follows:   Order Comments: Continue normal dosing of eliquis . DO NOT stop this medication without talking to a cardiologist   Start amiodarone 400mg  twice a day for a total of 10 days, you will then decrease to 200mg  once a day     Discharge: Follow Up with...   Order Comments: Please follow up with an EKG/nurse visit in 2 weeks.  Please follow up with your primary electrophysiology care team in ~8 weeks.   Our office will contact you for specific time and date. If you have not heard from them in 1 week, please call the office.    Jacob Mason, MD  Cathlean Lager, PA  Jacob Dam, RN  [phone]    [facility]   [address],  [address], [place], [place] [phone]  [address]  [address], [place], [place] [phone]  [address], [place], [place] [address] [phone]     Restrictions as Follows:   Order Comments: Activity Restrictions  Do not lift greater than 10 pounds for one week post procedure.  No lifting, pushing pulling, or exercise for one week post procedure.  You may walk and climb stairs the day after the procedure.   You may drive 24 hours after anesthesia.   Do not operative a motorized  vehicle if you are taking narcotics for pain.   Full activity may be resumed one week post procedure.     Wound Care As Follows:   Order Comments: Wound Care - Cardiac Cath   Do not soak groin or wrist wound in hot tubs, swimming pools, tub baths for one week or until wound is completely healed.   The wound bandage may be removed one day after procedure.   You may shower one day after procedure.   Wash puncture site (groin or wrist) with soap and water only.  Keep wound clean and dry.   No dressing or tape is necessary unless the catheter site is sensitive, then you may use a bandaid for comfort.   Do not apply lotions or powders to catheter incision site for  three days.     In Case of Emergency  Call your Cardiologist immediately for any signs of local wound infection such as swelling, redness, drainage, bleeding, and/or fever of 101 degrees or higher.    If any pulsitile bleeding or large swelling under the skin, lie down, apply pressure and call 911.     Referral to Cardiac Rehabilitation   Order Comments: -  For diabetic patients:  May obtain a fingerstick glucose and follow guidance outlined in the Policy and Procedure.  -  Emergency orders:  Follow protocols outlined in the procedure Management of Medical Emergencies in the Cardiac and Pulmonary Rehabilitation Phase II and III Programs.  -  May transition to a Phase III/IV Exercise program post completion of Cardiac Rehab Program.     Order Specific Question Answer Comments   Refer to? [facility]    Initial Treatment Plan:  Physican-Prescribed Exercise: Initiate monitored exercise program per the Exercise Prescription Procedure Protocol.    Initial Treatment Plan:  Physican-Prescribed Exercise: Observe patient for signs of exercise intolerance and adapt or stop exercise, as indicated.    Initial Treatment Plan:  Physican-Prescribed Exercise: Complete functional assessments.    Frequency: Monitored exercise 2-5 days/week.    Intensity/Time: Target Rate of Perceived  Exertion (RPE):  11-15 (3-4 on CR10 Scale).    Intensity/Time: If no ETT is available, the THR may be calculated based on Exercise Prescription Procedure Protocol.    Intensity/Time: For functional capacity LESS than 4 Mets:  Weeks 1-2: 10-30 mins; Weeks 3-4: 15-40 mins; Week 5 to D/C: 25-60 mins.    Intensity/Time: For functional capacity GREATER than 4 Mets:  Weeks 1-2: 15-40 mins; Weeks 3 to D/C: 20-60 mins.    Mode: Aerobic and muscle strengthening exercises consisting of a variety of arm and leg exercises.    Nutrition: Complete nutrition assessment and implement plan.    Psychosocial assessment: Have patient complete outcome assessments to include functional, behavioral, and health-related tools on initial session.    Oxygen: Patient may exercise with current home oxygen prescription. If SpO2 falls below 90%, supplemental oxygen may be used to maintain SpO2 GREATER than or EQUAL to 90% following established hospital protocols.            The nature of cardiac risk has been fully discussed with this patient. I have discussed the appropriate diet. The need for lifelong compliance in order to reduce risk is stressed. A regular exercise program is recommended to help achieve and maintain normal body weight, fitness and improve lipid balance.     All questions and concerns were addressed with Jacob A Urieta.        Time spent on discharge: >35 minutes       Discharge summary prepared by: Avelina Moose, NP        *Some images could not be shown.

## 2023-10-26 NOTE — Unmapped (Signed)
 10/26/23 1518   Case Management Discharge Sign-Off    Case Management Discharge Sign-Off Transition Plan Complete - Ready for Discharge     Patient is transitioning home today. No CM needs.      Melvyn Gerrie BETHANN OBIE, CCM

## 2023-10-27 NOTE — ED Provider Notes (Signed)
 [facility]  [address]  [place] [address]  Dept: [phone]  Loc: [phone]             HPI   Jacob Turner is a 63 y.o. male who presents emergency room with 2 complaints.  First complaint and primary reason for visit was he had bruising on his right wrist.  He recently underwent a heart catheterization.  He is currently on Plavix  and Eliquis .  He has a history of A-fib.  Also recently started on amiodarone.  Denies any fever chills or cough.  Denies any chest pain but he notes shortness of breath which is a second reason he came in.  Overall he states he feels better than when he had to go to the hospital.  Was also recently started on multiple different medications which she states is overwhelming him as he is never taking medications before.          Patient History     Past Medical History:  Past Medical History:   Diagnosis Date   . Atrial fibrillation (HCC) 05/05/2023   . Carotid artery stenosis, asymptomatic, right 10/12/2023   . Cerebral artery occlusion with cerebral infarction (HCC)     2024   . Intracranial atherosclerosis 05/07/2023   . Left carotid artery occlusion 10/12/2023       Past Surgical History:   No past surgical history on file.    Family History:   Family History   Problem Relation Age of Onset   . Hypertension Mother    . Heart Attack Mother    . Hypertension Father    . Heart Attack Father    . Vision Problems Sister    . No Known Problems Sister    . No Known Problems Sister    . No Known Problems Sister    . No Known Problems Brother    . Cancer Brother    . No Known Problems Brother    . No Known Problems Maternal Grandmother    . No Known Problems Maternal Grandfather    . No Known Problems Paternal Grandmother    . No Known Problems Paternal Grandfather        Social History:   Social History     Socioeconomic History   . Marital status: Single     Spouse name: Not on file   . Number  of children: Not on file   . Years of education: Not on file   . Highest education level: Not on file   Occupational History   . Not on file   Tobacco Use   . Smoking status: Some Days     Current packs/day: 1.00     Types: Cigarettes     Passive exposure: Current   . Smokeless tobacco: Never   . Tobacco comments:     3 cigs daily   Vaping Use   . Vaping status: Never Used   Substance and Sexual Activity   . Alcohol use: Never   . Drug use: No   . Sexual activity: Not on file   Other Topics Concern   . Caffeine Concerns Greater Than 3 Cups Per Day Yes     Comment: sweet tea 1/2 gal daily   . Exercise No   . Energy Drinks No   . Special Diet No   Social History Narrative   . Not on file     Social Drivers of  Health     Financial Resource Strain: Not on file   Food Insecurity: Food Insecurity Present (05/05/2023)    Received from [facility]    Hunger Vital Sign    . Worried About Programme researcher, broadcasting/film/video in the Last Year: Often true    . Ran Out of Food in the Last Year: Often true   Transportation Needs: No Transportation Needs (05/05/2023)    Received from [facility]    PRAPARE - Transportation    . Lack of Transportation (Medical): No    . Lack of Transportation (Non-Medical): No   Physical Activity: Not on file   Stress: Not on file   Social Connections: Not on file   Intimate Partner Violence: Not on file   Housing Stability: Low Risk  (05/05/2023)    Received from [facility]    Housing Stability Vital Sign    . Unable to Pay for Housing in the Last Year: No    . Number of Times Moved in the Last Year: 1    . Homeless in the Last Year: No       Home Medications:   Home Medication List - Marked as Reviewed on 10/23/23 1451   Medication Sig   amiodarone (CORDARONE) 200 mg PO TABS Take 1 Tab by Mouth Once a Day.   amiodarone (CORDARONE) 200 mg PO TABS Take 2 Tabs by Mouth Twice Daily for 7 days, THEN 2 Tabs Once a Day for 7 days, THEN 1 Tab Once a Day  for 14 days.   apixaban  (ELIQUIS ) 5 mg PO TABS Take 1 Tab by Mouth Twice Daily.   atorvastatin  (LIPITOR ) 80 mg PO TABS Take 1 Tab by Mouth every evening.   clopidogreL  (PLAVIX ) 75 mg PO TABS Take 1 Tab by Mouth Once a Day.   metoprolol  (LOPRESSOR ) 25 mg PO TABS Take 1 Tab by Mouth Twice Daily.   ranolazine (RANEXA) 500 mg PO TB12 Take 1 Tab by Mouth Twice Daily.       Allergies:   Allergies   Allergen Reactions   . Penicillins other/intolerance     Pins and needles throughout body          Physical Exam     Vitals:    10/27/23 2307   BP: 135/67   Resp: 18   Temp: 98.2 F (36.8 C)   SpO2: 97%   Weight: 94.3 kg (208 lb)   Height: 5' 10 (1.778 m)     Physical Exam  Vitals and nursing note reviewed.   Constitutional:       General: He is not in acute distress.     Appearance: Normal appearance. He is not ill-appearing.   HENT:      Head: Normocephalic and atraumatic.      Mouth/Throat:      Mouth: Mucous membranes are moist.   Eyes:      Pupils: Pupils are equal, round, and reactive to light.   Cardiovascular:      Rate and Rhythm: Normal rate and regular rhythm.   Pulmonary:      Effort: Pulmonary effort is normal. No respiratory distress.   Abdominal:      General: Abdomen is flat. There is no distension.      Tenderness: There is no abdominal tenderness.   Musculoskeletal:         General: No swelling. Normal range of motion.   Skin:  General: Skin is warm and dry.      Capillary Refill: Capillary refill takes less than 2 seconds.   Neurological:      General: No focal deficit present.      Mental Status: He is alert and oriented to person, place, and time.   Psychiatric:         Mood and Affect: Mood normal.         Behavior: Behavior normal.                   ROS   A 10 point ROS was completed and negative except as mentioned in HPI    PMD/Specialists   PMD:  Damien LELON Romance, MD     __________________________    Medications  (ED/RX)     Meds administered in ER this visit:  Medications - No data to display    New  prescriptions given to patient:  Discharge Medication List as of 10/28/2023 12:20 AM                 MDM   MDM  Number of Diagnoses or Management Options  Bruising at injection site  Shortness of breath  Diagnosis management comments: Is a 63 year old gentleman who presents to the emergency department with shortness of breath as well as bruising to his right wrist.  His wrist is postoperative bruising from his heart catheterization.  Discussed that he will bruise easily because he is on 2 different blood thinners.  No other findings that are concerning for vascular compromise or DVT.  He also states he feels short of breath.  He appears comfortable.  Respirations are even and unlabored.  PE not suspected.  Not coughing.  Lungs grossly clear.  Will get x-ray check labs.  Is not having any chest pain.  Mainly check CBC to rule out anemia given his anticoagulation as well as a BMP to assess for renal function.  Given recent heart catheter do not feel cardiac markers will be beneficial.  Expect them to be elevated.  He has no chest pains well and with recent negative heart cath, acute coronary syndrome is thought to be very unlikely.      ED Course as of 10/28/23 0248   Sun Oct 28, 2023   0013 Labs are unremarkable.  CBC without anemia.  No leukocytosis.  X-rays negative.  Patient stable for discharge [MB]         RADIOLOGY     CHEST PA AND LATERAL   Final Result      No active cardiopulmonary disease.      Signed By: Norleen MARLA Beech, MD on 10/28/2023 12:03 AM         EKG 12 LEAD UNIT PERFORMED                LABS     Results for orders placed or performed during the hospital encounter of 10/27/23 (from the past 24 hours)   BASIC METABOLIC PANEL   Result Value Ref Range    Potassium 4.3 3.5 - 5.5 mmol/L    Sodium 140 133 - 145 mmol/L    Chloride 106 98 - 110 mmol/L    Glucose 113 (H) 70 - 99 mg/dL    Calcium  9.8 8.4 - 10.5 mg/dL    BUN 23 (H) 6 - 22 mg/dL    Creatinine 1.2 0.8 - 1.6 mg/dL    CO2 20 20 - 32 mmol/L    eGFR  66.6 >60.0 mL/min/1.73  sq.m.    Anion Gap 14.0 3.0 - 15.0 mmol/L   CBC WITH DIFFERENTIAL AUTO   Result Value Ref Range    WBC 10.4 4.0 - 11.0 K/uL    RBC 4.96 3.80 - 5.80 M/uL    HGB 14.6 13.1 - 17.2 g/dL    HCT 56.5 60.6 - 48.3 %    MCV 88 80 - 95 fL    MCH 29 26 - 34 pg    MCHC 34 31 - 36 g/dL    RDW 86.7 89.9 - 84.4 %    Platelet 195 140 - 440 K/uL    MPV 10.0 9.0 - 13.0 fL    Segmented Neutrophils (Auto) 69 40 - 75 %    Lymphocytes (Auto) 18 (L) 20 - 45 %    Monocytes (Auto) 11 3 - 12 %    Eosinophils (Auto) 2 0 - 6 %    Basophils (Auto) 1 0 - 2 %    Absolute Neutrophils (Auto) 7.2 1.8 - 7.7 K/uL    Absolute Lymphocytes (Auto) 1.8 1.0 - 4.8 K/uL    Absolute Monocytes (Auto) 1.1 (H) 0.1 - 1.0 K/uL    Absolute Eosinophils (Auto) 0.2 0.0 - 0.5 K/uL    Absolute Basophils (Auto) 0.1 0.0 - 0.2 K/uL        EKG    EKG Interpretation, as per my read:    Sinus rhythm at a rate of 65 with PACs  Left axis deviation  Normal intervals  No acute ST segment elevation or depression, nonspecific T wave abnormality, unchanged from prior  Overall compared to prior EKG however significant QRS amplitude changes of multiple leads, especially inferiorly and anteriorly    Critical Care Time         Procedures   Procedures           Impression   (R06.02) Shortness of breath    (T80.89XA) Bruising at injection site         Disposition   Discharge          Follow Up Info (next 45 days)       Follow up with Mari Damien ORN, MD    Specialty: Family Practice    Phone: [phone]    Where: [facility]  (prior to 10/22/2021)      Monday Nov 12, 2023    RN Clnic Visit with SCSN NURSE at 11:00 AM    Where: [facility] )      [facility]   [address]  [place] [address]  [phone]                This note was completed using dragon dictation.  Please excuse any errors or contact me for questions or concerns.        Ozell Public, DO  Emergency Physician

## 2024-05-19 NOTE — H&P (Signed)
 [facility]  Camie Colder, MD        Assessment/Plan:  Jacob Turner is a 64 y.o. who presents today for planned right carotid endarterectomy.    Lysle of procedure, options and all potential risks and benefits, including the risks of bleeding, infection, vessel damages and stroke and cardiopulmonary compromise were discussed in detail and all questions were answered. Patient agrees to go ahead with the procedure.    HPI:  Jacob Turner is a 64 y.o. y/o male who presents today for planned right carotid endarterectomy.      Last seen 01/2024.  Has right carotid stenosis 70-80% and left ICA occlusion.  Asymptomatic. Has held Eliquis  in anticipation of this procedure w/clearance from cardiology.    01/11/2024:  Last seen in June 2025, the patient was feeling unwell and was referred to Dr. Leodis, who arranged a consultation at the heart hospital. The patient underwent cardioversion and had a follow-up with an electrophysiologist on December 31, 2023, during which medications were adjusted. The current medications include Eliquis , metoprolol , amiodarone, ranolazine, and clopidogrel .      Dizziness has improved.  Previously with multiple episodes of possible amaurosis fugax, no repeat episodes since last office appt.       Per EP, recommend ablation for afib in the future, but ok to proceed with carotid surgery first.     Initially discussed possibility of stump syndrome given constellation of symptoms, but in the setting of high-grade right ICA stenosis and distal left ICA occlusion, stump syndrome unlikely.  Will plan right ICA CEA, evaluate further thereafter for other etiologies if left amaurosis recurs.     10/12/2023:  The patient presents for evaluation of carotid artery stenosis, atrial fibrillation, and fatigue.     The patient was diagnosed with carotid artery stenosis, with severe right ICA stenosis and left chronic occluded. They experienced a TIA versus CVA in June 2024, which  resulted in vision loss in the left eye, speech difficulties, facial drooping, and issues with difficulty with the right armDifficulty inserting a key into the car ignition at the time of TIA versus CVA. Also describes difficulty raising the left arm which is persistent but this seems to be more of a shoulder impingement issue. Current medications include Eliquis , atorvastatin , and Plavix .  He was discharged after his hospitalization in December 2024 on metoprolol , however no longer taking this.  Metoprolol  was discontinued possibly due to expiration of hospital issued prescription and further due to insurance issues and a switch to Medicaid.     He reports he has had continued intermittent episodes of amaurosis fugax in the left eye.  Denies any other focal or lateralizing deficits or weakness.    He has been having issues scheduling with cardiology.  Initially he also had difficulty scheduling with us , but he now has a Medicaid card.  However still has not been able to schedule up with cardiology.  The patient reports fatigue during ambulation, slight dizziness, exhaustion, heavy breathing, rapid heart rate, and leg cramping. There is no chest pain. The patient continues to smoke but has reduced the rate and is attempting to quit.     Review of care everywhere module reveals that the patient was admitted at an outside hospital in December 2020 for with increasing shortness of breath and dyspnea on exertion.  Patient recalls that this was a an admission for pneumonia.  However review of records demonstrate that the patient had a chemical stress test at that admission which  was abnormal with findings of ischemia.  Cardiology saw the patient in the hospital and the patient declined any further workup including cardiac catheterization.    SOCIAL HISTORY  - Smokes, reduced intake, attempting to quit    MEDICATIONS  Eliquis , atorvastatin , Plavix     Past Medical History:   Diagnosis Date    Atrial fibrillation (HCC)  05/05/2023    Cardiac arrhythmia     Carotid artery stenosis, asymptomatic, right 10/12/2023    Cerebral artery occlusion with cerebral infarction [facility])     2024    Esophageal reflux     Hypertension     Intracranial atherosclerosis 05/07/2023    Left carotid artery occlusion 10/12/2023       Past Surgical History:   Procedure Laterality Date    CARDIAC ABLATION         No current facility-administered medications on file prior to encounter.     Current Outpatient Medications on File Prior to Encounter   Medication Sig Dispense Refill    amiodarone (CORDARONE) 200 mg PO TABS Take 1 Tab by Mouth Once a Day. 90 Tab 2    apixaban  (ELIQUIS ) 5 mg PO TABS Take 1 Tab by Mouth Twice Daily. 180 Tab 2    atorvastatin  (LIPITOR ) 80 mg PO TABS Take 1 Tab by Mouth every evening. 90 Tab 3    clopidogreL  (PLAVIX ) 75 mg PO TABS Take 1 Tab by Mouth Once a Day. (Patient not taking: Reported on 05/19/2024)      metoprolol  XL (TOPROL  XL) 25 mg PO TB24 Take 1 Tab by Mouth Once a Day. 90 Tab 2    ranolazine (RANEXA) 500 mg PO TB12 Take 1 Tab by Mouth Twice Daily. 60 Tab 3       Allergies   Allergen Reactions    Penicillins other/intolerance     Pins and needles throughout body       Family History   Problem Relation Age of Onset    Hypertension Mother     Heart Attack Mother     Hypertension Father     Heart Attack Father     Vision Problems Sister     No Known Problems Sister     No Known Problems Sister     No Known Problems Sister     No Known Problems Brother     Cancer Brother     No Known Problems Brother     No Known Problems Maternal Grandmother     No Known Problems Maternal Grandfather     No Known Problems Paternal Grandmother     No Known Problems Paternal Grandfather        Social History     Socioeconomic History    Marital status: Single     Spouse name: Not on file    Number of children: Not on file    Years of education: Not on file    Highest education level: Not on file   Occupational  History    Not on file   Tobacco Use    Smoking status: Every Day     Current packs/day: 0.30     Average packs/day: 0.3 packs/day for 1 year (0.3 ttl pk-yrs)     Types: Cigarettes     Start date: 2025     Passive exposure: Current    Smokeless tobacco: Never    Tobacco comments:     3 cigs daily   Vaping Use    Vaping status: Never Used  Substance and Sexual Activity    Alcohol use: Never    Drug use: No    Sexual activity: Not on file   Other Topics Concern    Caffeine Concerns Greater Than 3 Cups Per Day Yes     Comment: sweet tea 1/2 gal daily    Exercise No    Energy Drinks No    Special Diet No   Social History Narrative    Not on file     Social Drivers of Health     Financial Resource Strain: Not on file   Food Insecurity: Food Insecurity Present (05/05/2023)    Received from [facility]    Hunger Vital Sign     Within the past 12 months, you worried that your food would run out before you got the money to buy more.: Often true     Within the past 12 months, the food you bought just didn't last and you didn't have money to get more.: Often true   Transportation Needs: No Transportation Needs (05/05/2023)    Received from [facility]    PRAPARE - Therapist, Art (Medical): No     Lack of Transportation (Non-Medical): No   Physical Activity: Not on file   Stress: Not on file   Social Connections: Not on file   Intimate Partner Violence: Not on file   Housing Stability: Low Risk  (05/05/2023)    Received from [facility]    Housing Stability Vital Sign     In the last 12 months, was there a time when you were not able to pay the mortgage or rent on time?: No     In the past 12 months, how many times have you moved where you were living?: 1     At any time in the past 12 months, were you homeless or living in a shelter (including now)?: No       Review of Systems -   General: No fevers or  chills  Cardiac:No chest pain  Pulmonary: No shortness of breath    PE:  BP 153/71   Pulse 50   Temp 98.2 F (36.8 C)   Resp 16   Ht 5' 10 (1.778 m)   Wt 96.8 kg (213 lb 6.4 oz)   SpO2 97%   BMI 30.62 kg/m  BMI: 30.62    WNWD NAD  A&O x3  Heart RRR s1 s2 no M/R/G  Lungs Clear Bilaterally  Abd soft NT ND NABS  Extremities:Motor intact throughout  Cranial Nerves Grossly Intact, NIHSS 0  Normal Motor and Sensory function in extremities    Camie Colder, MD

## 2024-05-20 NOTE — Progress Notes (Signed)
 [facility]  PROGRESS NOTE  Ozell JINNY Ebbing, PA-C        Patient Jacob Turner, 64 y.o. male   MRN [id number]   [facility]       Date: 05/20/2024    Post-Op Day: 1   S/p Right carotid endarterectomy with bovine patch angioplasty, with neuromonitoring     Assessment:   64 y.o. male with right ica stenosis  S/p above procedure  Known left ica occlusion.  Above overnight events notes.   Patient had been made npo last evening secondary to nausea and vomiting.   Patient started on Protonix gtt.   Patient this morning is quite angry that he was not able to eat overnight and that he was not allowed to take his home medication, Tums, overnight.   Dr. Korene attempted to explain reasoning for NPO and reasoning for overnight care for patient.  Patient became increasingly belligerent and yelling at Dr. Korene.  Patient refused to listen to Dr. Korene.  Patient could not be redirected.        Plan:   D/c aline,   Resume diet.  D/c Protonix gtt.   Resume Eliquis .  Increase activity, ambulate  Discharge to home today  Subjective:   64 y.o. male without complaints this am.  Denies right neck pain, denies abdominal pain.     Objective:   Admit weight: Weight: 93.9 kg (207 lb)  Last recorded weight: Weight: 96.8 kg (213 lb 6.4 oz)    Temp (24hrs), Avg:97.6 F (36.4 C), Min:97.5 F (36.4 C), Max:97.7 F (36.5 C)    Visit Vitals  BP 119/58   Pulse 50   Temp 97.7 F (36.5 C)   Resp 21   Ht 5' 10 (1.778 m)   Wt 96.8 kg (213 lb 6.4 oz)   SpO2 (!) 89%   BMI 30.62 kg/m     Body mass index is 30.62 kg/m.    Intake/Output Summary (Last 24 hours) at 05/20/2024 0849  Last data filed at 05/20/2024 0839  Gross per 24 hour   Intake 2115.16 ml   Output 550 ml   Net 1565.16 ml     BP 119/58   Pulse 50   Temp 97.7 F (36.5 C)   Resp 21   Ht 5' 10 (1.778 m)   Wt 96.8 kg (213 lb 6.4 oz)   SpO2 (!) 89%   BMI 30.62 kg/m     General: Awake, alert, and oriented.  Agitated, angry this am.    HEENT: Normocephalic and atraumatic   Neck: Trachea midline.  No JVD.  Small hematoma right neck.  Incision c/d/i  Heart: Regular rate  Regular Rhythm  Lungs: Symmetrical chest expansion. Normal effort   Abdomen: Soft, non-tender  Extremities: No cyanosis or edema.   Neuro: Alert and oriented x3  Follows basic commands. UE and LE motor and sensory function grossly intact  Cranial nerves 2-12 grossly intact. .    Skin: Warm and dry.         Labs:     Complete Blood Count with Differential   Lab Results   Component Value Date/Time    WBC 13.5 (H) 05/20/2024 04:31 AM    RBC 4.31 05/20/2024 04:31 AM    HEMOGLOBIN 13.0 (L) 05/20/2024 04:31 AM    HCT 38.5 (L) 05/20/2024 04:31 AM    MCV 89 05/20/2024 04:31 AM    MCH 30 05/20/2024 04:31 AM    MCHC 34 05/20/2024 04:31 AM  RDW 12.4 05/20/2024 04:31 AM    PLATELET 195 05/20/2024 04:31 AM    MPV 10.5 05/20/2024 04:31 AM    SEGS 69 10/27/2023 11:43 PM    LYMPHOCYTES 18 (L) 10/27/2023 11:43 PM    MONOS 11 10/27/2023 11:43 PM    EOS 2 10/27/2023 11:43 PM    BASOS 1 10/27/2023 11:43 PM        Basic Metabolic Profile   Lab Results   Component Value Date    NA 133 05/19/2024    POTASSIUM 4.6 05/19/2024    CHLORIDE 104 05/19/2024    CO2 20 05/19/2024    BUN 21 05/19/2024    CREAT 1.1 05/19/2024    GLUCOSE 128 (H) 05/19/2024    CALCIUM  9.2 05/19/2024        Bedside Glucose Monitor   No results for input(s): GLUPOC in the last 48 hours.     Hepatic Function Panel   No results found for: ALBUMIN, TOTPR, SGOTAST, SGPTALT, ALKPHOS, AGRAT, BILIT, BILID, GLOBULIN      Coagulation   Lab Results   Component Value Date    PT 10.4 05/12/2024    INR 1.04 05/12/2024        Last HgA1C     No results found for: HGBA1C     Arterial Blood Gases   No results found for: LPMO2, FIO2, PEEP, PH1, PCO2, PO2, HCO3, BEBLOOD, TCO2, O2SATA, MODE, RRATE, VOLUME, DRAWSITE     Microbiology   No results found for this visit on 05/19/24.      Imaging  Recent  Results (from the past 36 hours)   1. CT CTA ABDOMEN W/WO INC POST PROC    Narrative    Indication: Severe new onset abdominal pain, distention, dark brown emesis .    Technique: CT angiography of the abdominal aorta and visceral vessels was performed.  Initial noncontrast images through the abdomen were obtained.  Following this, the patient was given a rapid bolus injection of IV contrast and scans obtained from the thoracic diaphragms through the aortic bifurcation.  Axial images are reviewed.  In addition, multiple sagittal and coronal reformatted CTA MIP images were generated and reviewed.  Additional 3D Volume rendered reconstructions as well as curved planar reformats were performed. Automated exposure control adjustment of mA and KV according to patient size and/or iterative reconstruction technique were used for this exam.      Comparison: None available.    Findings:    VASCULAR:      The abdominal aorta demonstrate arthrosclerotic calcifications but is normal in caliber with no aneurysm or dissection identified.   A normal aortic bifurcation is noted.    The celiac artery, SMA, IMA, and renal arteries are patent. The left renal artery demonstrates narrowing proximally secondary to calcified and noncalcified plaque.    Lower thorax: Mild bibasilar atelectasis.    ABDOMEN:     Liver: Unremarkable.    Biliary / gallbladder:  Unremarkable.    Pancreas: Unremarkable.    Spleen: Unremarkable.    Adrenals: Unremarkable.    Kidneys: Tiny nonobstructing stones in the left kidney.    Lymph nodes: No abdominal lymphadenopathy.    Retroperitoneum: Unremarkable.    Small and large bowel: Fluid-filled distal esophagus and mild circumferential wall thickening. A few scattered colonic diverticula. No bowel obstruction where visualized.    Appendix: Unremarkable.    Peritoneum: No pneumoperitoneum.    Fluid: None.    Abdominal wall: Unremarkable.    Musculoskeletal: No lytic or blastic  lesion.Lower lumbar spine  spondylosis.      Impression    1. No acute findings.  2. Fluid-filled distal esophagus.  3. Narrowing of the left renal artery.  4. Tiny nonobstructing stones in the left kidney.    Signed By: Oneil LELON Mitchell, MD on 05/20/2024 1:44 AM        No results found for this or any previous visit from the past 30 days.      Michael J Zugarek, PA-C  1/13/20268:49 AM

## 2024-05-22 ENCOUNTER — Inpatient Hospital Stay
Admit: 2024-05-22 | Discharge: 2024-05-22 | Disposition: A | Discharge status: Home / Self Care | Payer: Medicaid (Managed Care) | Arrived: VH

## 2024-05-22 DIAGNOSIS — K5900 Constipation, unspecified: Secondary | ICD-10-CM

## 2024-05-22 MED ORDER — CLOPIDOGREL BISULFATE 75 MG PO TABS
75 | ORAL | Status: AC
Start: 2024-05-22 — End: 2024-05-22
  Administered 2024-05-22: 14:00:00 75 mg via ORAL

## 2024-05-22 MED ORDER — SENNA-DOCUSATE SODIUM 8.6-50 MG PO TABS
8.6-50 | ORAL_TABLET | Freq: Every day | ORAL | 0 refills | 30.00000 days | Status: AC
Start: 2024-05-22 — End: ?

## 2024-05-22 MED ORDER — TAMSULOSIN HCL 0.4 MG PO CAPS
0.4 | ORAL_CAPSULE | Freq: Every day | ORAL | 0 refills | 30.00000 days | Status: AC
Start: 2024-05-22 — End: ?

## 2024-05-22 MED ORDER — POLYETHYLENE GLYCOL 3350 17 GM/SCOOP PO POWD
17 | Freq: Two times a day (BID) | ORAL | 1 refills | 22.50000 days | Status: AC
Start: 2024-05-22 — End: 2024-06-21

## 2024-05-22 MED ORDER — MAGNESIUM CITRATE PO SOLN
Freq: Once | ORAL | Status: AC
Start: 2024-05-22 — End: 2024-05-22
  Administered 2024-05-22: 15:00:00 296 mL via ORAL

## 2024-05-22 MED FILL — MAGNESIUM CITRATE 1.745 GM/30ML PO SOLN: 1.745 GM/30ML | ORAL | Qty: 296 | Fill #0

## 2024-05-22 MED FILL — CLOPIDOGREL BISULFATE 75 MG PO TABS: 75 mg | ORAL | Qty: 1 | Fill #0

## 2024-05-22 NOTE — ED Provider Notes (Signed)
 "[facility] EMERGENCY DEPARTMENT  EMERGENCY DEPARTMENT ENCOUNTER       Pt Name: Jacob Turner  MRN: [id number]  Birthdate 10/23/1960  Date of evaluation: 05/22/2024  Provider: Belvie FORBES Lines, MD   PCP: Claudene Elvie RAMAN, APRN - NP       CHIEF COMPLAINT       Chief Complaint   Patient presents with    Rectal Bleeding    Abdominal Pain    Urinary Retention    Constipation        HISTORY OF PRESENT ILLNESS: 1 or more elements      History From: Patient, History limited by: none     Jacob Turner is a 64 y.o. male with history of recent hospitalization at [facility] for right carotid endarterectomy.  Was just discharged from the hospital yesterday and has been recovering well.  However, patient advised she has been somewhat constipated since being hospitalized.  He did have a hard bowel movement yesterday but states it was a struggle.  During his bowel meant, he had a little bit of blood only with wiping.  Denies gross hematochezia or melena.  Also endorses associated abdominal distention and urinary retention.  States she has had difficulty fully emptying his bladder.  However, upon arrival to the emergency ferment, patient states he just peed and now feels substantially better.  Feels that he was finally able to empty his bladder.  He is not feeling much better.  However, patient believes he needs something to help with his constipation.  Denies any current abdominal pain, chest pain, dysuria, hematuria, flank pain, fever or chills, or other acute complaints or symptoms       Please See MDM for Additional Details of the HPI/PMH  Nursing Notes were all reviewed and agreed with or any disagreements were addressed in the HPI.     REVIEW OF SYSTEMS        Positives and Pertinent negatives as per HPI.    PAST HISTORY     Past Medical History:  Past Medical History:   Diagnosis Date    Scabies        Past Surgical History:  Past Surgical History:   Procedure Laterality Date    HERNIA REPAIR          Family History:  Family History   Problem Relation Age of Onset    Heart Disease Mother     Heart Failure Father     Hypertension Father        Social History:  Social History     Tobacco Use    Smoking status: Every Day     Current packs/day: 0.50     Types: Cigarettes    Smokeless tobacco: Never   Vaping Use    Vaping status: Never Used   Substance Use Topics    Alcohol use: Yes    Drug use: No       Allergies:  Allergies   Allergen Reactions    Penicillins Other (See Comments)     Pins and needles throughout body       CURRENT MEDICATIONS      Previous Medications    APIXABAN  (ELIQUIS ) 5 MG TABS TABLET    Take 1 tablet by mouth 2 times daily    ATORVASTATIN  (LIPITOR ) 80 MG TABLET    Take 1 tablet by mouth nightly    CLOPIDOGREL  (PLAVIX ) 75 MG TABLET    Take 1 tablet by mouth daily  METOPROLOL  TARTRATE (LOPRESSOR ) 50 MG TABLET    Take 1 tablet by mouth 2 times daily       SCREENINGS               No data recorded            PHYSICAL EXAM      ED Triage Vitals [05/22/24 0808]   Encounter Vitals Group      BP (!) 154/61      Girls Systolic BP Percentile       Girls Diastolic BP Percentile       Boys Systolic BP Percentile       Boys Diastolic BP Percentile       Pulse 69      Respirations 18      Temp 98 F (36.7 C)      Temp Source Oral      SpO2 99 %      Weight - Scale 98.4 kg (217 lb)      Height 1.778 m (5' 10)      Head Circumference       Peak Flow       Pain Score       Pain Loc       Pain Education       Exclude from Growth Chart         Physical Exam  Constitutional:       Appearance: Normal appearance.   HENT:      Head: Normocephalic and atraumatic.   Eyes:      Extraocular Movements: Extraocular movements intact.      Pupils: Pupils are equal, round, and reactive to light.   Cardiovascular:      Rate and Rhythm: Normal rate and regular rhythm.      Pulses: Normal pulses.      Heart sounds: Normal heart sounds.   Pulmonary:      Effort: Pulmonary effort is normal.      Breath sounds: Normal  breath sounds.   Abdominal:      General: There is distension.      Palpations: Abdomen is soft.      Tenderness: There is no abdominal tenderness.   Musculoskeletal:         General: Normal range of motion.      Cervical back: Neck supple.   Skin:     General: Skin is warm and dry.   Neurological:      General: No focal deficit present.      Mental Status: He is alert and oriented to person, place, and time.          DIAGNOSTIC RESULTS   LABS:     No results found for this or any previous visit (from the past 24 hours).      RADIOLOGY:  Non-plain film images such as CT, Ultrasound and MRI are read by the radiologist. Plain radiographic images are visualized and preliminarily interpreted by the ED Provider with the findings documented in the MDM section.     Interpretation per the Radiologist below, if available at the time of this note:     No orders to display        PROCEDURES   Unless otherwise noted below, none  Procedures     CRITICAL CARE TIME       EMERGENCY DEPARTMENT COURSE and DIFFERENTIAL DIAGNOSIS/MDM   Vitals:    Vitals:    05/22/24 0808   BP: (!) 154/61   Pulse:  69   Resp: 18   Temp: 98 F (36.7 C)   TempSrc: Oral   SpO2: 99%   Weight: 98.4 kg (217 lb)   Height: 1.778 m (5' 10)        Patient was given the following medications:  Medications   magnesium  citrate solution 296 mL (has no administration in time range)   clopidogrel  (PLAVIX ) tablet 75 mg (has no administration in time range)       Medical Decision Making  Patient is a 64 year old male with history as above presents emergency department with abdominal distention, constipation and urinary retention.  On arrival, patient is afebrile and hemodynamically stable.  Mild distention but otherwise soft and nontender.  No CVA tenderness.  Suspect his abdominal discomfort was secondary to constipation and urinary retention.  He now feels much improved now that he was able to better empty his bladder here in the emergency department.  Regarding his  rectal bleeding, this only occurred with wiping in setting of constipation.  I did offer and recommend rectal exam but patient declined.  I also offered lab work to check his electrolytes coags and hemoglobin but patient also declined.  States that he did not feel like this was necessary and he felt like he just needed medicine for his constipation.  I did perform bladder scan and patient was retaining roughly 600 cc.  Recommended Foley catheter while symptomatically treating him for his constipation.  Patient did not want to do this and would prefer to try conservative measures despite this recommendation.  He is declining Foley.  Therefore we will treat symptomatically.  Was given a dose of magnesium  citrate here in the emergency department.  He also had missed a dose of his Plavix  and this was provided here in the ED.  Will discharge with prescription for Flomax , MiraLAX  and senna and he was advised to aggressively uptitrate his MiraLAX  and to having regular soft bowel movements.  Also cautioned on use of opiates if not needed as this will worsen his constipation.  He was given strict return precautions for recurrent or worsening urinary retention.  Also counseled on strict return precautions for other new or worsening symptoms.  He was discharged in stable condition    Risk  OTC drugs.  Prescription drug management.          CLINICAL MANAGEMENT TOOLS:             Social determinants of health that will affect patient's care are:   Poor Health Literacy (Additional Time Provided in Explanation and Poor access to outpatient care/follow up (Provided Outpatient Resources)    FINAL IMPRESSION     1. Constipation, unspecified constipation type    2. Urinary retention          DISPOSITION/PLAN   Jacob LABOR Fitting's  results have been reviewed with him.  He has been counseled regarding his diagnosis, treatment, and plan.  He verbally conveys understanding and agreement of the signs, symptoms, diagnosis, treatment and  prognosis and additionally agrees to follow up as discussed.  He also agrees with the care-plan and conveys that all of his questions have been answered.  I have also provided discharge instructions for him that include: educational information regarding their diagnosis and treatment, and list of reasons why they would want to return to the ED prior to their follow-up appointment, should his condition change.     CLINICAL IMPRESSION    Discharge Note: The patient is stable for discharge home. The  signs, symptoms, diagnosis, and discharge instructions have been discussed, understanding conveyed, and agreed upon. The patient is to follow up as recommended or return to ER should their symptoms worsen.      PATIENT REFERRED TO:  Claudene Elvie RAMAN, APRN - NP  [address]  [place] [address]  [phone]             DISCHARGE MEDICATIONS:     Medication List        START taking these medications      polyethylene glycol 17 GM/SCOOP powder  Commonly known as: GLYCOLAX   Take 17 g by mouth 2 times daily Uptitrate aggressively as needed until having regular soft bowel movements.     sennosides-docusate sodium  8.6-50 MG tablet  Commonly known as: SENOKOT-S  Take 1 tablet by mouth daily     tamsulosin  0.4 MG capsule  Commonly known as: FLOMAX   Take 1 capsule by mouth daily            ASK your doctor about these medications      apixaban  5 MG Tabs tablet  Commonly known as: Eliquis   Take 1 tablet by mouth 2 times daily     atorvastatin  80 MG tablet  Commonly known as: LIPITOR   Take 1 tablet by mouth nightly     clopidogrel  75 MG tablet  Commonly known as: PLAVIX   Take 1 tablet by mouth daily     metoprolol  tartrate 50 MG tablet  Commonly known as: LOPRESSOR   Take 1 tablet by mouth 2 times daily               Where to Get Your Medications        These medications were sent to [facility] [address] [place], [place] - [address] [address] [phone] [address] [phone]  [address] [place] [address]       Phone: [phone]   polyethylene glycol 17 GM/SCOOP powder  sennosides-docusate sodium  8.6-50 MG tablet  tamsulosin  0.4 MG capsule           DISCONTINUED MEDICATIONS:  Current Discharge Medication List          I am the Primary Clinician of Record.   Belvie FORBES Lines, MD (electronically signed)    (Please note that parts of this dictation were completed with voice recognition software. Quite often unanticipated grammatical, syntax, homophones, and other interpretive errors are inadvertently transcribed by the computer software. Please disregards these errors. Please excuse any errors that have escaped final proofreading.)         Lines Belvie FORBES, MD  05/22/24 [phone]    "

## 2024-05-22 NOTE — ED Triage Notes (Signed)
"  Patient ambulatory to triage with steady gate. Patient recently discharged from [facility] for carotid artery surgery on 1/13.  States since surgery he is unable to void fully, has noticed blood coming from his rectum after attempting to have a bowel movement and abdominal pain.   "

## 2024-05-22 NOTE — ED Notes (Signed)
"  Patient given discharge papers. Patient understanding of discharge. Patient ambulatory out of ED    "

## 2024-05-25 ENCOUNTER — Inpatient Hospital Stay: Admit: 2024-05-25 | Discharge: 2024-05-25 | Payer: Medicaid (Managed Care) | Arrived: VH

## 2024-05-25 DIAGNOSIS — T889XXA Complication of surgical and medical care, unspecified, initial encounter: Secondary | ICD-10-CM

## 2024-05-25 DIAGNOSIS — T819XXA Unspecified complication of procedure, initial encounter: Principal | ICD-10-CM

## 2024-05-25 NOTE — ED Provider Notes (Signed)
 [facility] EMERGENCY DEPARTMENT  EMERGENCY DEPARTMENT ENCOUNTER          Pt Name: Jacob Turner  MRN: [id number]  Birthdate 05-Jun-1960  Date of evaluation: 05/25/2024  PCP: Claudene Elvie RAMAN, APRN - NP  Note Started: 8:35 PM 05/25/24     CHIEF COMPLAINT       Chief Complaint   Patient presents with    Post-op Problem        HISTORY OF PRESENT ILLNESS: 1 or more elements      History From: Patient  HPI Limitations: None  Arrival Method: Ambulatory  Evaluated in Room: 9  Chronic Conditions: Scabies, carotid artery stenosis s/p carotid endarterectomy 05/19/2024    OSTIN MATHEY is a 64 y.o. male who presents to ED for wound check.  Patient states that his discharge paperwork stated that if he has any swelling to the right side of the neck that he needs to be evaluated in the emergency department.  Patient reports that he was seen in the emergency department on 05/22/2024 for which she was evaluated for constipation and urinary retention and was told at that time that his neck looked okay.  Patient reports that he feels some frustration as he is not sure who to follow-up with and when.  Patient's surgeon is Dr. Korene and he sees Lacinda Mt with cardiology.  Patient denies chest pain, shortness of breath, dizziness, headache, drainage from the site, malodor, wound dehiscence, fever, chills.     Nursing Notes were all reviewed and agreed with or any disagreements were addressed in the HPI.    REVIEW OF SYSTEMS   Constitutional: neg for fever, chills  ENT:  neg for URI symptoms  Respiratory:  neg for cough, shortness of breath  Cardiovascular:  neg for chest pain  GI:  neg for abdominal pain.  GU:  No urinary symptoms. No Flank pain.  MSK: neg for back pain. Neg for extremity pain.   Integumentary: no rashes  Neurological: neg for headaches  All other systems reviewed negative with exception of positives in ROS and HPI.    Positives and Pertinent negatives as per HPI.    PAST HISTORY     Past Medical History:  Past  Medical History:   Diagnosis Date    Scabies        Past Surgical History:  Past Surgical History:   Procedure Laterality Date    HERNIA REPAIR         Family History:  Family History   Problem Relation Age of Onset    Heart Disease Mother     Heart Failure Father     Hypertension Father        Social History:  Social History     Socioeconomic History    Marital status: Single   Tobacco Use    Smoking status: Every Day     Current packs/day: 0.50     Types: Cigarettes    Smokeless tobacco: Never   Vaping Use    Vaping status: Never Used   Substance and Sexual Activity    Alcohol use: Not Currently    Drug use: No     Social Drivers of Health     Food Insecurity: Food Insecurity Present (05/05/2023)    Hunger Vital Sign     Worried About Running Out of Food in the Last Year: Often true     Ran Out of Food in the Last Year: Often true   Transportation Needs: No Transportation  Needs (05/05/2023)    PRAPARE - Therapist, Art (Medical): No     Lack of Transportation (Non-Medical): No   Housing Stability: Low Risk  (05/05/2023)    Housing Stability Vital Sign     Unable to Pay for Housing in the Last Year: No     Number of Times Moved in the Last Year: 1     Homeless in the Last Year: No       Allergies:  Allergies   Allergen Reactions    Penicillins Other (See Comments)     Pins and needles throughout body       CURRENT MEDICATIONS      No current facility-administered medications for this encounter.     Current Outpatient Medications   Medication Sig Dispense Refill    polyethylene glycol (GLYCOLAX ) 17 GM/SCOOP powder Take 17 g by mouth 2 times daily Uptitrate aggressively as needed until having regular soft bowel movements. 510 g 1    sennosides-docusate sodium  (SENOKOT-S) 8.6-50 MG tablet Take 1 tablet by mouth daily 30 tablet 0    tamsulosin  (FLOMAX ) 0.4 MG capsule Take 1 capsule by mouth daily 30 capsule 0    apixaban  (ELIQUIS ) 5 MG TABS tablet Take 1 tablet by mouth 2 times daily 180  tablet 3    metoprolol  tartrate (LOPRESSOR ) 50 MG tablet Take 1 tablet by mouth 2 times daily 60 tablet 3    clopidogrel  (PLAVIX ) 75 MG tablet Take 1 tablet by mouth daily 30 tablet 3    atorvastatin  (LIPITOR ) 80 MG tablet Take 1 tablet by mouth nightly (Patient not taking: Reported on 08/28/2023) 30 tablet 3           PHYSICAL EXAM      Vitals:    05/25/24 1028   BP: 127/74   Pulse: 84   Resp: 16   Temp: 98 F (36.7 C)   SpO2: 99%   Weight: 98.4 kg (217 lb)   Height: 1.778 m (5' 10)     Physical Exam        Vital signs and nursing notes reviewed.    CONSTITUTIONAL: Alert. Well-appearing; well-nourished; in no distress.  HEAD: Normocephalic; atraumatic.  EYES: PERRL; EOM's intact. No nystagmus. Conjunctiva clear.   ENT: TM's normal. External ear normal. Normal nose; no rhinorrhea. Normal pharynx. Tonsils not enlarged without exudate. Moist mucus membranes.  NECK: Supple; FROM without difficulty, non-tender; no cervical lymphadenopathy.             CV: Normal S1, S2; no murmurs, rubs, or gallops. No chest wall tenderness.  No carotid bruits.  RESPIRATORY: Normal chest excursion with respiration; breath sounds clear and equal bilaterally; no wheezes, rhonchi, or rales.  GI: Normal bowel sounds; non-distended; non-tender; no guarding or rigidity; no palpable organomegaly. No CVA tenderness.   BACK:  No evidence of trauma or deformity. Non-tender to palpation. FROM without difficulty. Negative straight leg raise bilaterally.  EXT: Normal ROM in all four extremities; non-tender to palpation.  SKIN: Right neck with healing surgical incision, mild ecchymosis, and mild edema; no exudate, bleeding.  Normal for age and race; warm; dry; good turgor; no apparent lesions or exudate.  VASCULAR: Pulses equal in all four extremities.  NEURO: A & O x3. Cranial nerves 2-12 intact. Motor 5/5 bilaterally. Sensation intact.   Truncal stability/gait and test of skew within normal limits.  PSYCH: Anxious mood and affect; pressured  speech.        DIAGNOSTIC RESULTS  LABS:    No results found for this or any previous visit (from the past 24 hours).    Labs Reviewed - No data to display         RADIOLOGY:  Non-plain film images such as CT, Ultrasound, and MRI are read by the radiologist. Plain radiographic images are visualized and preliminarily interpreted by the ED Provider with the below findings:          Interpretation per the Radiologist below, if available at the time of this note:  No orders to display           PROCEDURES   Unless otherwise noted below, none.  Procedures         CRITICAL CARE TIME   None      EMERGENCY DEPARTMENT COURSE and DIFFERENTIAL DIAGNOSIS/MDM   Vitals:    Vitals:    05/25/24 1028   BP: 127/74   Pulse: 84   Resp: 16   Temp: 98 F (36.7 C)   SpO2: 99%   Weight: 98.4 kg (217 lb)   Height: 1.778 m (5' 10)         Patient was given the following medications:  Medications - No data to display      Records Reviewed (source and summary): Nursing notes.      Social Determinants of Health Affecting Care:  Poor Health Literacy (Additional Time Provided in Explanation)      Screening Tools:    None      CLINICAL MANAGEMENT TOOLS:  Not Applicable      ED COURSE  ED Course as of 05/25/24 2035   [place] May 25, 2024   1149 I spoke with my supervising physician, Dr. Alm Gondola about the patient case in its entirety to include H&P.  MD/DO recommends CTA neck to assess for leak. Plan of dispo home with close f/u if neg and vascular consult in positive. [SG]   1151 Spoke with patient that CTA is ordered to r/o leak per recommendation of my supervising MD.  Informed patient that no signs of infection are appreciable on physical exam.  Patient voices understanding and has no questions at this time. [SG]   1206 Informed by charge nurse Jullie Backer, RN that patient eloped stating that he needed quicker treatment. Patient was informed that the workup has been initiated and is in process.  Patient was instructed to return to the ED  if he chooses to continue recommend workup. [SG]      ED Course User Index  [SG] Rosi Secrist A, PA-C         Medial Decision Making:    Is this patient to be included in the SEP-1 core measure? No Exclusion criteria - the patient is NOT to be included for SEP-1 Core Measure due to: 2+ SIRS criteria are not met    DDX: normal postop edema, hematoma, infection, seroma, carotid artery pseudoaneurysm    Patient is 64 y.o. male with a history of carotid artery stenosis, status post carotid endarterectomy on 05/19/2024, and scabies, presenting for a wound check. He is concerned about mild swelling on the right side of the neck and seeks reassurance, as his discharge instructions indicated evaluation for any swelling. The patient was previously seen on 05/22/2024 in the ED for constipation and urinary retention, where his neck was examined, and no abnormalities were noted. He is currently unsure about follow-up, as his surgeon is Dr. Korene and he also follows up with Lacinda Mt in cardiology.  The patient denies any concerning symptoms, including chest pain, shortness of breath, dizziness, headache, or any signs of wound complications, such as drainage, malodor, or fever.    On physical exam, the patient is well-appearing, alert, and in no distress. The surgical site on the right neck shows mild ecchymosis and mild edema with a healing incision but no signs of infection, dehiscence, or abnormal drainage. The neck is supple, with full range of motion and no cervical lymphadenopathy. No carotid bruits are appreciated. His cardiovascular and respiratory exams are normal, and there are no signs of acute complications from the carotid endarterectomy. No signs of infection are noted on physical exam.     In evaluation of the above differential diagnosis, consideration was given to the following tests and treatments:    Given the patient's post-operative status and concerns, I ordered a CTA to rule out a potential leak, as  per my supervising physicians recommendation. The patient was informed that no infection was appreciated on exam, and he verbalized understanding of the current management plan. However, the patient expressed frustration regarding the wait time in the ED and eloped, stating that he needed quicker treatment. He was instructed to return to the ED for further evaluation if he wishes to continue with the recommended workup.    Ambulating well.    I discussed each of these tests and considerations with the patient.       FINAL IMPRESSION     1. Complication of procedure, initial encounter    2. H/O carotid endarterectomy            DISPOSITION/PLAN   DISPOSITION Eloped - Left Before Treatment Complete 05/25/2024 08:33:37 PM   DISPOSITION CONDITION Stable                PATIENT REFERRED TO:  No follow-up provider specified.       DISCHARGE MEDICATIONS:     Medication List        ASK your doctor about these medications      apixaban  5 MG Tabs tablet  Commonly known as: Eliquis   Take 1 tablet by mouth 2 times daily     atorvastatin  80 MG tablet  Commonly known as: LIPITOR   Take 1 tablet by mouth nightly     clopidogrel  75 MG tablet  Commonly known as: PLAVIX   Take 1 tablet by mouth daily     metoprolol  tartrate 50 MG tablet  Commonly known as: LOPRESSOR   Take 1 tablet by mouth 2 times daily     polyethylene glycol 17 GM/SCOOP powder  Commonly known as: GLYCOLAX   Take 17 g by mouth 2 times daily Uptitrate aggressively as needed until having regular soft bowel movements.     sennosides-docusate sodium  8.6-50 MG tablet  Commonly known as: SENOKOT-S  Take 1 tablet by mouth daily     tamsulosin  0.4 MG capsule  Commonly known as: FLOMAX   Take 1 capsule by mouth daily                 I am the Primary Clinician of Record.   Sullivan DELENA Fried, PA-C (electronically signed)      (Please note that parts of this dictation were completed with voice recognition software. Quite often unanticipated grammatical, syntax, homophones, and other  interpretive errors are inadvertently transcribed by the computer software. Please disregards these errors. Please excuse any errors that have escaped final proofreading.)      Fried Sullivan DELENA, PA-C  05/25/24 2036

## 2024-05-25 NOTE — ED Triage Notes (Addendum)
 Patient reports he had carotid endartecetomy surgery on the right. He is going to see the surgeon on Monday but he wants someone to look at it
